# Patient Record
Sex: Female | Born: 1975 | Race: Asian | Hispanic: No | Marital: Married | Smoking: Never smoker
Health system: Southern US, Community
[De-identification: ages and names within clinical notes are randomized; demographics above are authoritative.]

---

## 2017-10-10 ENCOUNTER — Emergency Department (HOSPITAL_COMMUNITY): Payer: 59

## 2017-10-10 ENCOUNTER — Inpatient Hospital Stay (HOSPITAL_COMMUNITY)
Admission: EM | Admit: 2017-10-10 | Discharge: 2017-10-14 | DRG: 872 | Disposition: A | Discharge status: Home / Self Care | Payer: 59 | Attending: Internal Medicine | Admitting: Internal Medicine

## 2017-10-10 ENCOUNTER — Encounter (HOSPITAL_COMMUNITY): Payer: Self-pay

## 2017-10-10 ENCOUNTER — Other Ambulatory Visit: Payer: Self-pay

## 2017-10-10 DIAGNOSIS — A419 Sepsis, unspecified organism: Secondary | ICD-10-CM | POA: Diagnosis present

## 2017-10-10 DIAGNOSIS — Z23 Encounter for immunization: Secondary | ICD-10-CM

## 2017-10-10 DIAGNOSIS — B950 Streptococcus, group A, as the cause of diseases classified elsewhere: Secondary | ICD-10-CM | POA: Diagnosis present

## 2017-10-10 DIAGNOSIS — K760 Fatty (change of) liver, not elsewhere classified: Secondary | ICD-10-CM | POA: Diagnosis present

## 2017-10-10 DIAGNOSIS — D72829 Elevated white blood cell count, unspecified: Secondary | ICD-10-CM | POA: Diagnosis present

## 2017-10-10 DIAGNOSIS — N739 Female pelvic inflammatory disease, unspecified: Secondary | ICD-10-CM | POA: Diagnosis present

## 2017-10-10 DIAGNOSIS — R1030 Lower abdominal pain, unspecified: Secondary | ICD-10-CM | POA: Diagnosis present

## 2017-10-10 DIAGNOSIS — D649 Anemia, unspecified: Secondary | ICD-10-CM | POA: Diagnosis present

## 2017-10-10 DIAGNOSIS — R6510 Systemic inflammatory response syndrome (SIRS) of non-infectious origin without acute organ dysfunction: Secondary | ICD-10-CM

## 2017-10-10 LAB — CBC
HCT: 38.2 % (ref 36.0–46.0)
HEMATOCRIT: 35.7 % — AB (ref 36.0–46.0)
HEMOGLOBIN: 12.6 g/dL (ref 12.0–15.0)
HEMOGLOBIN: 11.8 g/dL — AB (ref 12.0–15.0)
MCH: 29.5 pg (ref 26.0–34.0)
MCH: 29.6 pg (ref 26.0–34.0)
MCHC: 33 g/dL (ref 30.0–36.0)
MCHC: 33.1 g/dL (ref 30.0–36.0)
MCV: 89.5 fL (ref 78.0–100.0)
MCV: 89.5 fL (ref 78.0–100.0)
Platelets: 419 10*3/uL — ABNORMAL HIGH (ref 150–400)
Platelets: 398 10*3/uL (ref 150–400)
RBC: 4.27 MIL/uL (ref 3.87–5.11)
RBC: 3.99 MIL/uL (ref 3.87–5.11)
RDW: 12.7 % (ref 11.5–15.5)
RDW: 12.6 % (ref 11.5–15.5)
WBC: 24.1 10*3/uL — ABNORMAL HIGH (ref 4.0–10.5)
WBC: 26.6 10*3/uL — ABNORMAL HIGH (ref 4.0–10.5)

## 2017-10-10 LAB — WET PREP, GENITAL
Clue Cells Wet Prep HPF POC: NONE SEEN
Sperm: NONE SEEN
Trich, Wet Prep: NONE SEEN
Yeast Wet Prep HPF POC: NONE SEEN

## 2017-10-10 LAB — COMPREHENSIVE METABOLIC PANEL
ALBUMIN: 3.2 g/dL — AB (ref 3.5–5.0)
ALT: 80 U/L — ABNORMAL HIGH (ref 0–44)
AST: 41 U/L (ref 15–41)
Alkaline Phosphatase: 334 U/L — ABNORMAL HIGH (ref 38–126)
Anion gap: 15 (ref 5–15)
BILIRUBIN TOTAL: 1.4 mg/dL — AB (ref 0.3–1.2)
BUN: <5 mg/dL — ABNORMAL LOW (ref 6–20)
CO2: 26 mmol/L (ref 22–32)
Calcium: 9.7 mg/dL (ref 8.9–10.3)
Chloride: 96 mmol/L — ABNORMAL LOW (ref 98–111)
Creatinine, Ser: 0.83 mg/dL (ref 0.44–1.00)
GFR calc Af Amer: >60 mL/min (ref >60)
GFR calc non Af Amer: >60 mL/min (ref >60)
GLUCOSE: 132 mg/dL — AB (ref 70–99)
POTASSIUM: 4.2 mmol/L (ref 3.5–5.1)
SODIUM: 137 mmol/L (ref 135–145)
Total Protein: 8.1 g/dL (ref 6.5–8.1)

## 2017-10-10 LAB — URINALYSIS, ROUTINE W REFLEX MICROSCOPIC
BACTERIA UA: NONE SEEN
Bilirubin Urine: NEGATIVE
Glucose, UA: NEGATIVE mg/dL
Ketones, ur: NEGATIVE mg/dL
Nitrite: NEGATIVE
PROTEIN: NEGATIVE mg/dL
Specific Gravity, Urine: 1.008 (ref 1.005–1.030)
pH: 7 (ref 5.0–8.0)

## 2017-10-10 LAB — LIPASE, BLOOD: Lipase: 94 U/L — ABNORMAL HIGH (ref 11–51)

## 2017-10-10 LAB — CREATININE, SERUM
Creatinine, Ser: 0.71 mg/dL (ref 0.44–1.00)
GFR calc Af Amer: >60 mL/min (ref >60)
GFR calc non Af Amer: >60 mL/min (ref >60)

## 2017-10-10 LAB — I-STAT BETA HCG BLOOD, ED (MC, WL, AP ONLY): HCG, QUANTITATIVE: 16.5 m[IU]/mL — AB (ref <5)

## 2017-10-10 LAB — I-STAT CG4 LACTIC ACID, ED: LACTIC ACID, VENOUS: 1.5 mmol/L (ref 0.5–1.9)

## 2017-10-10 LAB — PREGNANCY, URINE: PREG TEST UR: NEGATIVE

## 2017-10-10 MED ORDER — SODIUM CHLORIDE 0.9 % IV SOLN
1.0000 g | Freq: Once | INTRAVENOUS | Status: AC
  Administered 2017-10-10: 1 g via INTRAVENOUS
  Filled 2017-10-10: qty 10

## 2017-10-10 MED ORDER — PIPERACILLIN-TAZOBACTAM 3.375 G IVPB
3.3750 g | Freq: Three times a day (TID) | INTRAVENOUS | Status: DC
  Administered 2017-10-11 – 2017-10-13 (×8): 3.375 g via INTRAVENOUS
  Filled 2017-10-10 (×7): qty 50

## 2017-10-10 MED ORDER — PIPERACILLIN-TAZOBACTAM 3.375 G IVPB 30 MIN
3.3750 g | Freq: Once | INTRAVENOUS | Status: AC
  Administered 2017-10-10: 3.375 g via INTRAVENOUS
  Filled 2017-10-10: qty 50

## 2017-10-10 MED ORDER — ADULT MULTIVITAMIN W/MINERALS CH
1.0000 | ORAL_TABLET | Freq: Every day | ORAL | Status: DC
  Administered 2017-10-11 – 2017-10-14 (×4): 1 via ORAL
  Filled 2017-10-10 (×4): qty 1

## 2017-10-10 MED ORDER — KETOROLAC TROMETHAMINE 15 MG/ML IJ SOLN
15.0000 mg | Freq: Four times a day (QID) | INTRAMUSCULAR | Status: DC | PRN
  Administered 2017-10-11 – 2017-10-12 (×3): 15 mg via INTRAVENOUS
  Filled 2017-10-10 (×3): qty 1

## 2017-10-10 MED ORDER — ENOXAPARIN SODIUM 40 MG/0.4ML ~~LOC~~ SOLN
40.0000 mg | SUBCUTANEOUS | Status: DC
  Filled 2017-10-10: qty 0.4

## 2017-10-10 MED ORDER — SODIUM CHLORIDE 0.9 % IV SOLN
100.0000 mg | Freq: Two times a day (BID) | INTRAVENOUS | Status: DC
  Administered 2017-10-11 – 2017-10-12 (×5): 100 mg via INTRAVENOUS
  Filled 2017-10-10 (×6): qty 100

## 2017-10-10 MED ORDER — SODIUM CHLORIDE 0.9 % IV BOLUS
1000.0000 mL | Freq: Once | INTRAVENOUS | Status: AC
  Administered 2017-10-10: 1000 mL via INTRAVENOUS

## 2017-10-10 MED ORDER — SODIUM CHLORIDE 0.9 % IV SOLN
100.0000 mg | Freq: Once | INTRAVENOUS | Status: AC
  Administered 2017-10-10: 100 mg via INTRAVENOUS
  Filled 2017-10-10: qty 100

## 2017-10-10 MED ORDER — SODIUM CHLORIDE 0.9 % IV SOLN
INTRAVENOUS | Status: DC
  Administered 2017-10-10 – 2017-10-13 (×5): via INTRAVENOUS

## 2017-10-10 MED ORDER — VITAMIN E 180 MG (400 UNIT) PO CAPS
400.0000 [IU] | ORAL_CAPSULE | Freq: Every day | ORAL | Status: DC
  Administered 2017-10-11 – 2017-10-14 (×4): 400 [IU] via ORAL
  Filled 2017-10-10 (×4): qty 1

## 2017-10-10 MED ORDER — IBUPROFEN 400 MG PO TABS
600.0000 mg | ORAL_TABLET | Freq: Once | ORAL | Status: AC
  Administered 2017-10-10: 600 mg via ORAL
  Filled 2017-10-10: qty 1

## 2017-10-10 MED ORDER — IOPAMIDOL (ISOVUE-300) INJECTION 61%
100.0000 mL | Freq: Once | INTRAVENOUS | Status: AC
  Administered 2017-10-10: 100 mL via INTRAVENOUS

## 2017-10-10 NOTE — ED Notes (Signed)
Pt feeling better

## 2017-10-10 NOTE — Consult Note (Signed)
Reason for Consult: Pelvic abscess Referring Physician: Darl Householder MD  Angel Anderson is an 42 y.o. female.  HPI: Asked to see patient at the request of Dr. Darl Householder for pelvic abscess.  The patient relates a one-week history of low-grade pelvic pain.  She had been exercising recently and thought it was muscle soreness.  The pain is been a low-grade just above her pubis and has been progressive over the last 7 days.  The patient denies any nausea or vomiting.  The patient denies any blood in her stool constipation or diarrhea and her bowel function has been normal.  The patient denies any dysuria or pelvic discharge.  CT scan was done which showed a complex left adnexal mass which appears to be an abscess.  Patient feels better since she has been in the emergency room since this morning with IV fluids and antibiotics.  Her pain is quite minimal now.  She is hungry.  History reviewed. No pertinent past medical history.  History reviewed. No pertinent surgical history.  History reviewed. No pertinent family history.  Social History:  reports that she has never smoked. She has never used smokeless tobacco. She reports that she does not drink alcohol. Her drug history is not on file.  Allergies: No Known Allergies  Medications: I have reviewed the patient's current medications.  Results for orders placed or performed during the hospital encounter of 10/10/17 (from the past 48 hour(s))  Urinalysis, Routine w reflex microscopic     Status: Abnormal   Collection Time: 10/10/17  9:50 AM  Result Value Ref Range   Color, Urine YELLOW YELLOW   APPearance CLEAR CLEAR   Specific Gravity, Urine 1.008 1.005 - 1.030   pH 7.0 5.0 - 8.0   Glucose, UA NEGATIVE NEGATIVE mg/dL   Hgb urine dipstick SMALL (A) NEGATIVE   Bilirubin Urine NEGATIVE NEGATIVE   Ketones, ur NEGATIVE NEGATIVE mg/dL   Protein, ur NEGATIVE NEGATIVE mg/dL   Nitrite NEGATIVE NEGATIVE   Leukocytes, UA TRACE (A) NEGATIVE   RBC / HPF 0-5 0 - 5  RBC/hpf   WBC, UA 11-20 0 - 5 WBC/hpf   Bacteria, UA NONE SEEN NONE SEEN   Squamous Epithelial / LPF 0-5 0 - 5    Comment: Performed at [facility], [place] [address], [place], [place] [address]  Lipase, blood     Status: Abnormal   Collection Time: 10/10/17  9:54 AM  Result Value Ref Range   Lipase 94 (H) 11 - 51 U/L    Comment: Performed at [facility] [address], [place], [place] [address]  Comprehensive metabolic panel     Status: Abnormal   Collection Time: 10/10/17  9:54 AM  Result Value Ref Range   Sodium 137 135 - 145 mmol/L   Potassium 4.2 3.5 - 5.1 mmol/L   Chloride 96 (L) 98 - 111 mmol/L   CO2 26 22 - 32 mmol/L   Glucose, Bld 132 (H) 70 - 99 mg/dL   BUN <5 (L) 6 - 20 mg/dL   Creatinine, Ser 0.83 0.44 - 1.00 mg/dL   Calcium 9.7 8.9 - 10.3 mg/dL   Total Protein 8.1 6.5 - 8.1 g/dL   Albumin 3.2 (L) 3.5 - 5.0 g/dL   AST 41 15 - 41 U/L   ALT 80 (H) 0 - 44 U/L   Alkaline Phosphatase 334 (H) 38 - 126 U/L   Total Bilirubin 1.4 (H) 0.3 - 1.2 mg/dL   GFR calc non  Af Amer >60 >60 mL/min   GFR calc Af Amer >60 >60 mL/min    Comment: (NOTE) The eGFR has been calculated using the CKD EPI equation. This calculation has not been validated in all clinical situations. eGFR's persistently <60 mL/min signify possible Chronic Kidney Disease.    Anion gap 15 5 - 15    Comment: Performed at [facility] [address] [place], [place] [address]  CBC     Status: Abnormal   Collection Time: 10/10/17  9:54 AM  Result Value Ref Range   WBC 24.1 (H) 4.0 - 10.5 K/uL   RBC 4.27 3.87 - 5.11 MIL/uL   Hemoglobin 12.6 12.0 - 15.0 g/dL   HCT 38.2 36.0 - 46.0 %   MCV 89.5 78.0 - 100.0 fL   MCH 29.5 26.0 - 34.0 pg   MCHC 33.0 30.0 - 36.0 g/dL   RDW 12.7 11.5 - 15.5 %   Platelets 419 (H) 150 - 400 K/uL    Comment: Performed at [facility], [place] [address] [place], [place] [address]  I-Stat beta hCG blood, ED     Status: Abnormal   Collection Time: 10/10/17 10:18  AM  Result Value Ref Range   I-stat hCG, quantitative 16.5 (H) <5 mIU/mL   Comment 3            Comment:   GEST. AGE      CONC.  (mIU/mL)   <=1 WEEK        5 - 50     2 WEEKS       50 - 500     3 WEEKS       100 - 10,000     4 WEEKS     1,000 - 30,000        FEMALE AND NON-PREGNANT FEMALE:     LESS THAN 5 mIU/mL   Pregnancy, urine     Status: None   Collection Time: 10/10/17 11:56 AM  Result Value Ref Range   Preg Test, Ur NEGATIVE NEGATIVE    Comment:        THE SENSITIVITY OF THIS METHODOLOGY IS >20 mIU/mL. Performed at [facility], [place] [address] [place], [place] [address]   Wet prep, genital     Status: Abnormal   Collection Time: 10/10/17  5:57 PM  Result Value Ref Range   Yeast Wet Prep HPF POC NONE SEEN NONE SEEN   Trich, Wet Prep NONE SEEN NONE SEEN   Clue Cells Wet Prep HPF POC NONE SEEN NONE SEEN   WBC, Wet Prep HPF POC MANY (A) NONE SEEN   Sperm NONE SEEN     Comment: Performed at [facility], [place] [address], [place], [place] [address]  I-Stat CG4 Lactic Acid, ED     Status: None   Collection Time: 10/10/17  6:18 PM  Result Value Ref Range   Lactic Acid, Venous 1.50 0.5 - 1.9 mmol/L    US Transvaginal Non-ob  Result Date: 10/10/2017 CLINICAL DATA:  Pelvic pain EXAM: TRANSABDOMINAL AND TRANSVAGINAL ULTRASOUND OF PELVIS DOPPLER ULTRASOUND OF OVARIES TECHNIQUE: Both transabdominal and transvaginal ultrasound examinations of the pelvis were performed. Transabdominal technique was performed for global imaging of the pelvis including uterus, ovaries, adnexal regions, and pelvic cul-de-sac. It was necessary to proceed with endovaginal exam following the transabdominal exam to visualize the uterus endometrium and ovaries. Color and duplex Doppler ultrasound was utilized to evaluate blood flow to the ovaries. COMPARISON:  CT 10/10/2017 FINDINGS: Uterus Measurements: 7.6 x 4.6 x 5 cm. No fibroids or other mass visualized. Endometrium Thickness: 6.6 mm.  No focal  abnormality visualized. Right ovary Measurements: 3.6 x 1.6 x 2.8 cm. Small septated cyst versus adjacent cysts measuring 1.8 cm. Left ovary Measurements: 3.9 x 2.9 x 3.6 cm within the left adnexa, adjacent and possibly involving left ovary and extending over the superior aspect of left uterus is a large complex mass measuring 9 x 5.5 x 8.8 cm, corresponding to complex fluid collection on CT. 1.4 cm echogenic calcification at the left adnexa corresponding to CT calcification. Pulsed Doppler evaluation of both ovaries demonstrates normal low-resistance arterial and venous waveforms. Other findings Trace free fluid IMPRESSION: 1. Negative for ovarian torsion. 2. 9 cm complex mass within the left adnexa and adjacent to the left ovary, extending superior to the left aspect of uterus. Findings could be secondary to tubo-ovarian abscess in the correct clinical setting. Note that the complex mass cannot be distinguished from adjacent sigmoid colon on the CT and bowel perforation with abscess/secondary involvement of the left adnexa is also included in the differential. Electronically Signed   By: Donavan Foil M.D.   On: 10/10/2017 16:58   US Pelvis Complete  Result Date: 10/10/2017 CLINICAL DATA:  Pelvic pain EXAM: TRANSABDOMINAL AND TRANSVAGINAL ULTRASOUND OF PELVIS DOPPLER ULTRASOUND OF OVARIES TECHNIQUE: Both transabdominal and transvaginal ultrasound examinations of the pelvis were performed. Transabdominal technique was performed for global imaging of the pelvis including uterus, ovaries, adnexal regions, and pelvic cul-de-sac. It was necessary to proceed with endovaginal exam following the transabdominal exam to visualize the uterus endometrium and ovaries. Color and duplex Doppler ultrasound was utilized to evaluate blood flow to the ovaries. COMPARISON:  CT 10/10/2017 FINDINGS: Uterus Measurements: 7.6 x 4.6 x 5 cm. No fibroids or other mass visualized. Endometrium Thickness: 6.6 mm.  No focal abnormality  visualized. Right ovary Measurements: 3.6 x 1.6 x 2.8 cm. Small septated cyst versus adjacent cysts measuring 1.8 cm. Left ovary Measurements: 3.9 x 2.9 x 3.6 cm within the left adnexa, adjacent and possibly involving left ovary and extending over the superior aspect of left uterus is a large complex mass measuring 9 x 5.5 x 8.8 cm, corresponding to complex fluid collection on CT. 1.4 cm echogenic calcification at the left adnexa corresponding to CT calcification. Pulsed Doppler evaluation of both ovaries demonstrates normal low-resistance arterial and venous waveforms. Other findings Trace free fluid IMPRESSION: 1. Negative for ovarian torsion. 2. 9 cm complex mass within the left adnexa and adjacent to the left ovary, extending superior to the left aspect of uterus. Findings could be secondary to tubo-ovarian abscess in the correct clinical setting. Note that the complex mass cannot be distinguished from adjacent sigmoid colon on the CT and bowel perforation with abscess/secondary involvement of the left adnexa is also included in the differential. Electronically Signed   By: Donavan Foil M.D.   On: 10/10/2017 16:58   Ct Abdomen Pelvis W Contrast  Result Date: 10/10/2017 CLINICAL DATA:  Leukocytosis. Right lower quadrant and upper quadrant pain. EXAM: CT ABDOMEN AND PELVIS WITH CONTRAST TECHNIQUE: Multidetector CT imaging of the abdomen and pelvis was performed using the standard protocol following bolus administration of intravenous contrast. CONTRAST:  163m ISOVUE-300 IOPAMIDOL (ISOVUE-300) INJECTION 61% COMPARISON:  None. FINDINGS: Lower chest: Left basilar linear atelectasis or scarring is present. The heart size is normal. No significant pleural or pericardial effusion is present. Hepatobiliary: Fatty infiltration of the liver is suggested. No  discrete lesions are present. There is no biliary dilation. Common bile duct is normal. Pancreas: Unremarkable. No pancreatic ductal dilatation or surrounding  inflammatory changes. Spleen: Normal in size without focal abnormality. Adrenals/Urinary Tract: Adrenal glands are normal. A 12 mm simple cyst is present in the midportion of the left kidney. A second cyst measures 6 mm. Two subcentimeter cysts are present at the lower pole of the right kidney. Ureters are within normal limits bilaterally. The urinary bladder is normal. Stomach/Bowel: The stomach and duodenum are within normal limits. There is mild dilation of distal small bowel, likely ileus adjacent to the peripherally enhancing abscess. No obstruction is present. Terminal ileum is somewhat thickened. The appendix is visualized and within normal limits. The ascending and transverse colon are within normal limits. The proximal descending colon is mostly collapsed. The sigmoid colon passes immediately adjacent to the peripherally enhancing collection. Vascular/Lymphatic: Subcentimeter left iliac nodes are likely reactive. No significant adenopathy is present. Reproductive: The uterus is somewhat edematous. A multiloculated peripherally enhancing fluid collection lies immediately superior to the uterus and about the left adnexa. The largest component measures 5.8 x 4.2 x 3.2 cm. A 12 mm calcification is also present in the left adnexa. The right adnexa is unremarkable. Other: No layering free fluid is present. There is edema in the distal small bowel mesentery adjacent to the collection. No free air is present. Musculoskeletal: Vertebral body heights are normal. No focal lytic or blastic lesions are present. The bony pelvis is intact. The hips are located and within normal limits. IMPRESSION: 1. Complex peripherally enhancing fluid collection centered about the left adnexa and uterus may be loculated. The largest component measures 5.8 x 4.2 x 2.7 cm. This most likely represents a complicated tubo-ovarian abscess. Complicated sigmoid colitis is considered less likely without other inflammatory changes about the  colon. The colon appears to be collapsed and passes adjacent to the collection. 2. 12 mm calcification in the left adnexa likely represents a dystrophic left ovarian calcification. 3. Mild dilation of distal small bowel adjacent to the collection likely represents ileus and secondary inflammation. 4. Edema is present in the distal small bowel mesentery adjacent to the collection. 5. Probable hepatic steatosis. 6. Bilateral renal cysts appear benign. These results were called by telephone at the time of interpretation on 10/10/2017 at 2:42 pm to [facility], who verbally acknowledged these results. Electronically Signed   By: San Morelle M.D.   On: 10/10/2017 14:48   [place] Art/ven Flow Abd Pelv Doppler  Result Date: 10/10/2017 CLINICAL DATA:  Pelvic pain EXAM: TRANSABDOMINAL AND TRANSVAGINAL ULTRASOUND OF PELVIS DOPPLER ULTRASOUND OF OVARIES TECHNIQUE: Both transabdominal and transvaginal ultrasound examinations of the pelvis were performed. Transabdominal technique was performed for global imaging of the pelvis including uterus, ovaries, adnexal regions, and pelvic cul-de-sac. It was necessary to proceed with endovaginal exam following the transabdominal exam to visualize the uterus endometrium and ovaries. Color and duplex Doppler ultrasound was utilized to evaluate blood flow to the ovaries. COMPARISON:  CT 10/10/2017 FINDINGS: Uterus Measurements: 7.6 x 4.6 x 5 cm. No fibroids or other mass visualized. Endometrium Thickness: 6.6 mm.  No focal abnormality visualized. Right ovary Measurements: 3.6 x 1.6 x 2.8 cm. Small septated cyst versus adjacent cysts measuring 1.8 cm. Left ovary Measurements: 3.9 x 2.9 x 3.6 cm within the left adnexa, adjacent and possibly involving left ovary and extending over the superior aspect of left uterus is a large complex mass measuring 9 x 5.5 x 8.8 cm, corresponding to  complex fluid collection on CT. 1.4 cm echogenic calcification at the left adnexa corresponding to CT  calcification. Pulsed Doppler evaluation of both ovaries demonstrates normal low-resistance arterial and venous waveforms. Other findings Trace free fluid IMPRESSION: 1. Negative for ovarian torsion. 2. 9 cm complex mass within the left adnexa and adjacent to the left ovary, extending superior to the left aspect of uterus. Findings could be secondary to tubo-ovarian abscess in the correct clinical setting. Note that the complex mass cannot be distinguished from adjacent sigmoid colon on the CT and bowel perforation with abscess/secondary involvement of the left adnexa is also included in the differential. Electronically Signed   By: Donavan Foil M.D.   On: 10/10/2017 16:58    Review of Systems  Constitutional: Positive for chills and fever.  HENT: Negative for hearing loss.   Eyes: Negative for blurred vision and double vision.  Respiratory: Negative for cough and hemoptysis.   Cardiovascular: Negative for chest pain and palpitations.  Gastrointestinal: Positive for abdominal pain. Negative for constipation, diarrhea, nausea and vomiting.  Genitourinary: Negative for dysuria, flank pain, frequency, hematuria and urgency.  Musculoskeletal: Negative for back pain and myalgias.  Skin: Negative for rash.  Neurological: Negative for tremors and sensory change.  Endo/Heme/Allergies: Negative for environmental allergies. Does not bruise/bleed easily.  Psychiatric/Behavioral: The patient is not nervous/anxious and does not have insomnia.    Blood pressure 127/80, pulse (!) 101, temperature (!) 100.6 F (38.1 C), resp. rate 18, height '5\' 3"'  (1.6 m), weight 64 kg, last menstrual period 09/17/2017, SpO2 100 %. Physical Exam  Constitutional: She is oriented to person, place, and time. She appears well-developed and well-nourished.  HENT:  Head: Normocephalic and atraumatic.  Eyes: Pupils are equal, round, and reactive to light. No scleral icterus.  Neck: Normal range of motion. Neck supple.   Cardiovascular: Normal rate and regular rhythm.  Respiratory: Effort normal and breath sounds normal.  GI: Soft. She exhibits no distension and no mass. There is tenderness. There is no rebound and no guarding.  Musculoskeletal: Normal range of motion.  Neurological: She is alert and oriented to person, place, and time.  Skin: Skin is warm and dry.  Psychiatric: She has a normal mood and affect. Her behavior is normal.    Assessment/Plan: Pelvic abscess-the colon looks normal and CT scan there is no evidence of diverticuli nor history of diverticulitis in the past.  This appears to be a left adnexal mass/cyst.  He does have a white count of 24,000 and would favor GYN etiology.  No history of previous pelvic inflammatory disease or vaginal discharge though.  In any event, the treatment would be similar.  Recommend percutaneous drainage by interventional radiology and IV antibiotics.  I would cover for both GYN pathogens as well as colonic pathogens.  She is feeling much better with IV fluids and antibiotics.  Okay to advance diet but would make n.p.o. after midnight for interventional radiology if they feel this is drainable.  If not, continue IV antibiotics and we will follow along.  No acute surgical need at this point time.  Recommend further work-up if this condition resolves with colonoscopy and potential GYN evaluation.  Marqual Mi A Dineen Conradt 10/10/2017, 7:03 PM

## 2017-10-10 NOTE — ED Notes (Signed)
Pt taken to ultra sound  Antibiotic running

## 2017-10-10 NOTE — ED Notes (Signed)
thye surgeon has seen this pt

## 2017-10-10 NOTE — Progress Notes (Signed)
Report received from [place], [place]. Patient arrived to floor from ED. Alert and oriented x4. No family at bedside.

## 2017-10-10 NOTE — ED Notes (Signed)
Pt returned fron [place]

## 2017-10-10 NOTE — ED Notes (Signed)
Patient transported to CT 

## 2017-10-10 NOTE — ED Notes (Signed)
ic fluid and antibiotics started

## 2017-10-10 NOTE — ED Triage Notes (Signed)
Pt states she went to [facility] last week for abd pain and was called and told she had elevated WBC. She was given abx for UTI but pain has persisted.

## 2017-10-10 NOTE — ED Notes (Signed)
Pt  Returned from [place]  Pain med given  2nd antibiotic hung

## 2017-10-10 NOTE — ED Provider Notes (Signed)
[place] EMERGENCY DEPARTMENT Provider Note   CSN: [id number] Arrival date & time: 10/10/17  0931     History   Chief Complaint Chief Complaint  Patient presents with  . Abnormal Lab    HPI Angel Anderson is a 42 y.o. female with h/o kidney stone here for evaluation of abdominal pain. Onset 1 week ago. Described as mild, crampy, intermittent localized diffusely in lower abdomen.  Initially intermittent and worse after eating and during urination, now it is random, intermittent and not as severe. She went to urgent care 2 days ago and was diagnosed with UTI and has been taking macrobid x 2 days.  Since starting abx pain is not as severe and it is persistent.  Has been taking tylenol to help with pain with good relief.  Pain is worse with empty stomach and right before urination and having a BM.  Associated with chills and generalized weakness, fatigue.  Has noticed urinary frequency that she attributes to increased water intake.  LMP early august, s/p BTL and c-section.  No h/o ulcers, frequent ETOH or NSAID use. Was called by UC today and told to come to ER or PCP for WBC 24 and elevated liver enzymes.   No associated nausea, vomiting, diarrhea, constipation, dysuria, hematuria, changes in vaginal discharge or bleeding.    HPI  History reviewed. No pertinent past medical history.  There are no active problems to display for this patient.   History reviewed. No pertinent surgical history.   OB History   None      Home Medications    Prior to Admission medications   Medication Sig Start Date End Date Taking? Authorizing Provider  Multiple Vitamins-Minerals (MULTIVITAMIN WITH MINERALS) tablet Take 1 tablet by mouth daily.   Yes [provider]  nitrofurantoin, macrocrystal-monohydrate, (MACROBID) 100 MG capsule Take 100 mg by mouth 2 (two) times daily. x10 days   Yes [provider]  vitamin E 400 UNIT capsule Take 400 Units by mouth  daily.   Yes [provider]    Family History History reviewed. No pertinent family history.  Social History Social History   Tobacco Use  . Smoking status: Never Smoker  . Smokeless tobacco: Never Used  Substance Use Topics  . Alcohol use: Never    Frequency: Never  . Drug use: Not on file     Allergies   Patient has no known allergies.   Review of Systems Review of Systems  Constitutional: Positive for chills.  Gastrointestinal: Positive for abdominal pain.  Neurological: Positive for weakness.  All other systems reviewed and are negative.    Physical Exam Updated Vital Signs BP 127/80   Pulse (!) 101   Temp (!) 100.6 F (38.1 C)   Resp 18   Ht '5\' 3"'  (1.6 m)   Wt 64 kg   LMP 09/17/2017 (Within Days)   SpO2 100%   BMI 24.98 kg/m   Physical Exam  Constitutional: She is oriented to person, place, and time. She appears well-developed and well-nourished.  Non toxic  HENT:  Head: Normocephalic and atraumatic.  Nose: Nose normal.  MMM  Eyes: Pupils are equal, round, and reactive to light. Conjunctivae and EOM are normal.  Neck: Normal range of motion.  Cardiovascular: Normal rate and regular rhythm.  Pulmonary/Chest: Effort normal and breath sounds normal.  Abdominal: Soft. Bowel sounds are normal. There is tenderness.  Diffuse lower abdomen tenderness, worst at suprapubic and RLQ.  Positive McBurney's. Mild/minimal RUQ tenderness.  No G/R/R. No suprapubic or CVA tenderness. Negative Murphy's. Active BS to lower quadrants.   Genitourinary:  Genitourinary Comments:  External genitalia normal without erythema, edema, tenderness, discharge or lesions.  No groin lymphadenopathy.  Vaginal mucosa and cervix normal, pink without discharge or lesions.  Uterus in midline, smooth, not enlarged or tender.  No CMT. Non palpable adnexa.  Musculoskeletal: Normal range of motion.  Neurological: She is alert and oriented to person, place, and time.  Skin: Skin  is warm and dry. Capillary refill takes less than 2 seconds.  Psychiatric: She has a normal mood and affect. Her behavior is normal.  Nursing note and vitals reviewed.    ED Treatments / Results  Labs (all labs ordered are listed, but only abnormal results are displayed) Labs Reviewed  WET PREP, GENITAL - Abnormal; Notable for the following components:      Result Value   WBC, Wet Prep HPF POC MANY (*)    All other components within normal limits  LIPASE, BLOOD - Abnormal; Notable for the following components:   Lipase 94 (*)    All other components within normal limits  COMPREHENSIVE METABOLIC PANEL - Abnormal; Notable for the following components:   Chloride 96 (*)    Glucose, Bld 132 (*)    BUN <5 (*)    Albumin 3.2 (*)    ALT 80 (*)    Alkaline Phosphatase 334 (*)    Total Bilirubin 1.4 (*)    All other components within normal limits  CBC - Abnormal; Notable for the following components:   WBC 24.1 (*)    Platelets 419 (*)    All other components within normal limits  URINALYSIS, ROUTINE W REFLEX MICROSCOPIC - Abnormal; Notable for the following components:   Hgb urine dipstick SMALL (*)    Leukocytes, UA TRACE (*)    All other components within normal limits  I-STAT BETA HCG BLOOD, ED ([facility], [facility], [facility] ONLY) - Abnormal; Notable for the following components:   I-stat hCG, quantitative 16.5 (*)    All other components within normal limits  CULTURE, BLOOD (ROUTINE X 2)  CULTURE, BLOOD (ROUTINE X 2)  PREGNANCY, URINE  I-STAT CG4 LACTIC ACID, ED  I-STAT CG4 LACTIC ACID, ED  GC/CHLAMYDIA PROBE AMP ([place]) NOT AT [facility]    EKG None  Radiology US Transvaginal Non-ob  Result Date: 10/10/2017 CLINICAL DATA:  Pelvic pain EXAM: TRANSABDOMINAL AND TRANSVAGINAL ULTRASOUND OF PELVIS DOPPLER ULTRASOUND OF OVARIES TECHNIQUE: Both transabdominal and transvaginal ultrasound examinations of the pelvis were performed. Transabdominal technique was performed for global imaging of the  pelvis including uterus, ovaries, adnexal regions, and pelvic cul-de-sac. It was necessary to proceed with endovaginal exam following the transabdominal exam to visualize the uterus endometrium and ovaries. Color and duplex Doppler ultrasound was utilized to evaluate blood flow to the ovaries. COMPARISON:  CT 10/10/2017 FINDINGS: Uterus Measurements: 7.6 x 4.6 x 5 cm. No fibroids or other mass visualized. Endometrium Thickness: 6.6 mm.  No focal abnormality visualized. Right ovary Measurements: 3.6 x 1.6 x 2.8 cm. Small septated cyst versus adjacent cysts measuring 1.8 cm. Left ovary Measurements: 3.9 x 2.9 x 3.6 cm within the left adnexa, adjacent and possibly involving left ovary and extending over the superior aspect of left uterus is a large complex mass measuring 9 x 5.5 x 8.8 cm, corresponding to complex fluid collection on CT. 1.4 cm echogenic calcification at the left adnexa corresponding to CT calcification. Pulsed Doppler evaluation of both ovaries demonstrates normal  low-resistance arterial and venous waveforms. Other findings Trace free fluid IMPRESSION: 1. Negative for ovarian torsion. 2. 9 cm complex mass within the left adnexa and adjacent to the left ovary, extending superior to the left aspect of uterus. Findings could be secondary to tubo-ovarian abscess in the correct clinical setting. Note that the complex mass cannot be distinguished from adjacent sigmoid colon on the CT and bowel perforation with abscess/secondary involvement of the left adnexa is also included in the differential. Electronically Signed   By: Donavan Foil M.D.   On: 10/10/2017 16:58   US Pelvis Complete  Result Date: 10/10/2017 CLINICAL DATA:  Pelvic pain EXAM: TRANSABDOMINAL AND TRANSVAGINAL ULTRASOUND OF PELVIS DOPPLER ULTRASOUND OF OVARIES TECHNIQUE: Both transabdominal and transvaginal ultrasound examinations of the pelvis were performed. Transabdominal technique was performed for global imaging of the pelvis including  uterus, ovaries, adnexal regions, and pelvic cul-de-sac. It was necessary to proceed with endovaginal exam following the transabdominal exam to visualize the uterus endometrium and ovaries. Color and duplex Doppler ultrasound was utilized to evaluate blood flow to the ovaries. COMPARISON:  CT 10/10/2017 FINDINGS: Uterus Measurements: 7.6 x 4.6 x 5 cm. No fibroids or other mass visualized. Endometrium Thickness: 6.6 mm.  No focal abnormality visualized. Right ovary Measurements: 3.6 x 1.6 x 2.8 cm. Small septated cyst versus adjacent cysts measuring 1.8 cm. Left ovary Measurements: 3.9 x 2.9 x 3.6 cm within the left adnexa, adjacent and possibly involving left ovary and extending over the superior aspect of left uterus is a large complex mass measuring 9 x 5.5 x 8.8 cm, corresponding to complex fluid collection on CT. 1.4 cm echogenic calcification at the left adnexa corresponding to CT calcification. Pulsed Doppler evaluation of both ovaries demonstrates normal low-resistance arterial and venous waveforms. Other findings Trace free fluid IMPRESSION: 1. Negative for ovarian torsion. 2. 9 cm complex mass within the left adnexa and adjacent to the left ovary, extending superior to the left aspect of uterus. Findings could be secondary to tubo-ovarian abscess in the correct clinical setting. Note that the complex mass cannot be distinguished from adjacent sigmoid colon on the CT and bowel perforation with abscess/secondary involvement of the left adnexa is also included in the differential. Electronically Signed   By: Donavan Foil M.D.   On: 10/10/2017 16:58   Ct Abdomen Pelvis W Contrast  Result Date: 10/10/2017 CLINICAL DATA:  Leukocytosis. Right lower quadrant and upper quadrant pain. EXAM: CT ABDOMEN AND PELVIS WITH CONTRAST TECHNIQUE: Multidetector CT imaging of the abdomen and pelvis was performed using the standard protocol following bolus administration of intravenous contrast. CONTRAST:  147m ISOVUE-300  IOPAMIDOL (ISOVUE-300) INJECTION 61% COMPARISON:  None. FINDINGS: Lower chest: Left basilar linear atelectasis or scarring is present. The heart size is normal. No significant pleural or pericardial effusion is present. Hepatobiliary: Fatty infiltration of the liver is suggested. No discrete lesions are present. There is no biliary dilation. Common bile duct is normal. Pancreas: Unremarkable. No pancreatic ductal dilatation or surrounding inflammatory changes. Spleen: Normal in size without focal abnormality. Adrenals/Urinary Tract: Adrenal glands are normal. A 12 mm simple cyst is present in the midportion of the left kidney. A second cyst measures 6 mm. Two subcentimeter cysts are present at the lower pole of the right kidney. Ureters are within normal limits bilaterally. The urinary bladder is normal. Stomach/Bowel: The stomach and duodenum are within normal limits. There is mild dilation of distal small bowel, likely ileus adjacent to the peripherally enhancing abscess. No obstruction is present. Terminal  ileum is somewhat thickened. The appendix is visualized and within normal limits. The ascending and transverse colon are within normal limits. The proximal descending colon is mostly collapsed. The sigmoid colon passes immediately adjacent to the peripherally enhancing collection. Vascular/Lymphatic: Subcentimeter left iliac nodes are likely reactive. No significant adenopathy is present. Reproductive: The uterus is somewhat edematous. A multiloculated peripherally enhancing fluid collection lies immediately superior to the uterus and about the left adnexa. The largest component measures 5.8 x 4.2 x 3.2 cm. A 12 mm calcification is also present in the left adnexa. The right adnexa is unremarkable. Other: No layering free fluid is present. There is edema in the distal small bowel mesentery adjacent to the collection. No free air is present. Musculoskeletal: Vertebral body heights are normal. No focal lytic or  blastic lesions are present. The bony pelvis is intact. The hips are located and within normal limits. IMPRESSION: 1. Complex peripherally enhancing fluid collection centered about the left adnexa and uterus may be loculated. The largest component measures 5.8 x 4.2 x 2.7 cm. This most likely represents a complicated tubo-ovarian abscess. Complicated sigmoid colitis is considered less likely without other inflammatory changes about the colon. The colon appears to be collapsed and passes adjacent to the collection. 2. 12 mm calcification in the left adnexa likely represents a dystrophic left ovarian calcification. 3. Mild dilation of distal small bowel adjacent to the collection likely represents ileus and secondary inflammation. 4. Edema is present in the distal small bowel mesentery adjacent to the collection. 5. Probable hepatic steatosis. 6. Bilateral renal cysts appear benign. These results were called by telephone at the time of interpretation on 10/10/2017 at 2:42 pm to [facility], who verbally acknowledged these results. Electronically Signed   By: San Morelle M.D.   On: 10/10/2017 14:48   [place] Art/ven Flow Abd Pelv Doppler  Result Date: 10/10/2017 CLINICAL DATA:  Pelvic pain EXAM: TRANSABDOMINAL AND TRANSVAGINAL ULTRASOUND OF PELVIS DOPPLER ULTRASOUND OF OVARIES TECHNIQUE: Both transabdominal and transvaginal ultrasound examinations of the pelvis were performed. Transabdominal technique was performed for global imaging of the pelvis including uterus, ovaries, adnexal regions, and pelvic cul-de-sac. It was necessary to proceed with endovaginal exam following the transabdominal exam to visualize the uterus endometrium and ovaries. Color and duplex Doppler ultrasound was utilized to evaluate blood flow to the ovaries. COMPARISON:  CT 10/10/2017 FINDINGS: Uterus Measurements: 7.6 x 4.6 x 5 cm. No fibroids or other mass visualized. Endometrium Thickness: 6.6 mm.  No focal abnormality visualized.  Right ovary Measurements: 3.6 x 1.6 x 2.8 cm. Small septated cyst versus adjacent cysts measuring 1.8 cm. Left ovary Measurements: 3.9 x 2.9 x 3.6 cm within the left adnexa, adjacent and possibly involving left ovary and extending over the superior aspect of left uterus is a large complex mass measuring 9 x 5.5 x 8.8 cm, corresponding to complex fluid collection on CT. 1.4 cm echogenic calcification at the left adnexa corresponding to CT calcification. Pulsed Doppler evaluation of both ovaries demonstrates normal low-resistance arterial and venous waveforms. Other findings Trace free fluid IMPRESSION: 1. Negative for ovarian torsion. 2. 9 cm complex mass within the left adnexa and adjacent to the left ovary, extending superior to the left aspect of uterus. Findings could be secondary to tubo-ovarian abscess in the correct clinical setting. Note that the complex mass cannot be distinguished from adjacent sigmoid colon on the CT and bowel perforation with abscess/secondary involvement of the left adnexa is also included in the differential. Electronically Signed  By: Donavan Foil M.D.   On: 10/10/2017 16:58    Procedures .Critical Care Performed by: Kinnie Feil, PA-C Authorized by: Kinnie Feil, PA-C   Critical care provider statement:    Critical care time (minutes):  45   Critical care was necessary to treat or prevent imminent or life-threatening deterioration of the following conditions: pelvic abscess.   Critical care was time spent personally by me on the following activities:  Discussions with consultants, evaluation of patient's response to treatment, examination of patient, ordering and performing treatments and interventions, ordering and review of laboratory studies, ordering and review of radiographic studies, pulse oximetry, re-evaluation of patient's condition, obtaining history from patient or surrogate and review of old charts   I assumed direction of critical care for this  patient from another provider in my specialty: no     (including critical care time)  Medications Ordered in ED Medications  doxycycline (VIBRAMYCIN) 100 mg in sodium chloride 0.9 % 250 mL IVPB (100 mg Intravenous New Bag/Given 10/10/17 1656)  iopamidol (ISOVUE-300) 61 % injection 100 mL (100 mLs Intravenous Contrast Given 10/10/17 1406)  sodium chloride 0.9 % bolus 1,000 mL (0 mLs Intravenous Stopped 10/10/17 1806)  cefTRIAXone (ROCEPHIN) 1 g in sodium chloride 0.9 % 100 mL IVPB (0 g Intravenous Stopped 10/10/17 1652)  ibuprofen (ADVIL,MOTRIN) tablet 600 mg (600 mg Oral Given 10/10/17 1700)     Initial Impression / Assessment and Plan / ED Course  I have reviewed the triage vital signs and the nursing notes.  Pertinent labs & imaging results that were available during my care of the patient were reviewed by me and considered in my medical decision making (see chart for details).  Clinical Course as of Oct 10 1829  Fri Oct 10, 2017  1211 Alkaline Phosphatase(!): 334 [CG]  1212 ALT(!): 80 [CG]  1212 Total Bilirubin(!): 1.4 [CG]  1212 WBC(!): 24.1 [CG]  1212 Hgb urine dipstick(!): SMALL [CG]  1212 Leukocytes, UA(!): TRACE [CG]  1212 Hgb urine dipstick(!): SMALL [CG]  1212 Leukocytes, UA(!): TRACE [CG]  1506 IMPRESSION: 1. Complex peripherally enhancing fluid collection centered about the left adnexa and uterus may be loculated. The largest component measures 5.8 x 4.2 x 2.7 cm. This most likely represents a complicated tubo-ovarian abscess. Complicated sigmoid colitis is considered less likely without other inflammatory changes about the colon. The colon appears to be collapsed and passes adjacent to the collection. 2. 12 mm calcification in the left adnexa likely represents a dystrophic left ovarian calcification. 3. Mild dilation of distal small bowel adjacent to the collection likely represents ileus and secondary inflammation. 4. Edema is present in the distal small bowel mesentery  adjacent to the collection. 5. Probable hepatic steatosis. 6. Bilateral renal cysts appear benign.  These results were called by telephone at the time of interpretation on 10/10/2017 at 2:42 pm to [facility], who verbally acknowledged these results.   Electronically Signed By: San Morelle M.D. On: 10/10/2017 14:48    CT ABDOMEN PELVIS W CONTRAST [CG]  1726 IMPRESSION: 1. Negative for ovarian torsion. 2. 9 cm complex mass within the left adnexa and adjacent to the left ovary, extending superior to the left aspect of uterus. Findings could be secondary to tubo-ovarian abscess in the correct clinical setting. Note that the complex mass cannot be distinguished from adjacent sigmoid colon on the CT and bowel perforation with abscess/secondary involvement of the left adnexa is also included in the differential.     US Pelvis Complete [  CG]  1733 Spoke to OBGYN (Constant), recommends gen surgery consult first. Call back as needed. Gen surgery consult ordered.    [CG]  23 Spoke to Dr. Brantley Stage with general surgery, he favors pelvic abscess recommends admission for IV and IR consult for drain tube.    [CG]    Clinical Course User Index [CG] Kinnie Feil, PA-C    1326: WBC 24.1. Alk phos 334, ALT 80, total bili 1.4. Small hgb, trace leuks in UA without nitrites, bacteria.  Mild tachycardia noted, without fever. SIRS criteria met however pt is well appearing, afebrile. Considering early sepsis.  Will hold off on abx at this time. Blood culture sent. Dx includes appendicitis > cholecystitis > pancreatitis > pyelo less likely.  CT AP pending. She declined antiemetics and analgesics currently.   1540: CT AP concerning for TOA vs complicated sigmoid colitis.  Discussed results with pt. She confirms monogamous relationship with husband. No h/o PID, STDs. No vaginal dc, dysuria, hematuria. Attempted to perform pelvic, pt in [place].   1820: UA concerning for mas vs  colitis/bowel perf.  Discussed with OBGYN and general surgery.  Dr. Brantley Stage recommends admission for drainage by IR.  Consult unassigned for SIRS response, pelvic abscess.  Final Clinical Impressions(s) / ED Diagnoses   Final diagnoses:  SIRS (systemic inflammatory response syndrome) ([place])  Pelvic abscess in female    ED Discharge Orders    None       Arlean Hopping 10/10/17 1831    Drenda Freeze, MD 10/11/17 [phone]

## 2017-10-10 NOTE — H&P (Signed)
History and Physical   Angel Anderson ZOX:[id number] DOB: November 25, 1975 DOA: 10/10/2017  Referring MD/NP/PA: Dr. Silverio Lay  PCP: Patient, No Pcp Per   Outpatient Specialists: None  Patient coming from: Home  Chief Complaint: Abdominal pain  HPI: Angel Anderson is a 42 y.o. female with medical history significant of kidney stones otherwise no other significant history presenting to the ER with sudden onset of abdominal pain about a week ago rated as 8 out of 10.  Pain is described as crampy intermittent and diffuse in the lower abdomen to the pelvic area.  Patient has noticed this during urination as well as eating.  But the pain is now persistent and not related to any activity.  She did have urinary frequency but no discharge.  She has had BTL and C-section in the past.  Denied any high risk sexual encounters.  She is married with one partner.  Patient denied any other GI symptoms or history of any GI disease.  She was given Macrobid for the last 2 days for possible UTI.  She was seen at that point at urgent care center with blood drawn white count coming back as 24,000.  Patient came to the ER where she was evaluated and now found to have findings in the pelvic consistent with pelvic abscess.  She has been evaluated by surgery.  Patient is being admitted for treatment of her pelvic abscess including possible percutaneous drainage..  ED Course: Patient's temperature is 100.6 with blood pressure 147/84 pulse 107 respiratory rate of 18 oxygen sats 100% room air.  White count is 26.6 thousand with a left shift.  Hemoglobin 11.8 with chloride 98 and normal sodium.  Glucose is 132.  Lactic acid 1.5.  CT abdomen pelvis showed complex peripherally enhancing fluid collection about 5.8 x 4.2 x 2.7 cm in the left adnexa and uterus.  Suspected tubo-ovarian abscess which was complicated.  Also 12 mm calcification in the left adnexa and possible ileus in the area.  Pelvic ultrasound including transvaginal ultrasound  confirmed a 9 cm complex collection of pelvic abscess.  OB/GYN was initially consulted and deferred to general surgery who has evaluated patient and recommended medical admission with IV antibiotics and percutaneous drainage.  Review of Systems: As per HPI otherwise 10 point review of systems negative.    History reviewed. No pertinent past medical history.  History reviewed. No pertinent surgical history.   reports that she has never smoked. She has never used smokeless tobacco. She reports that she does not drink alcohol. Her drug history is not on file.  No Known Allergies  History reviewed. No pertinent family history.   Prior to Admission medications   Medication Sig Start Date End Date Taking? Authorizing Provider  Multiple Vitamins-Minerals (MULTIVITAMIN WITH MINERALS) tablet Take 1 tablet by mouth daily.   Yes [provider]  nitrofurantoin, macrocrystal-monohydrate, (MACROBID) 100 MG capsule Take 100 mg by mouth 2 (two) times daily. x10 days   Yes [provider]  vitamin E 400 UNIT capsule Take 400 Units by mouth daily.   Yes [provider]    Physical Exam: Vitals:   10/10/17 1345 10/10/17 1530 10/10/17 1530 10/10/17 1700  BP: 125/90 127/88 (!) 132/92 127/80  Pulse:  (!) 104 99 (!) 101  Resp:   18   Temp:   (!) 100.6 F (38.1 C)   TempSrc:      SpO2:  99%  100%  Weight:      Height:  Constitutional: NAD, calm, comfortable Vitals:   10/10/17 1345 10/10/17 1530 10/10/17 1530 10/10/17 1700  BP: 125/90 127/88 (!) 132/92 127/80  Pulse:  (!) 104 99 (!) 101  Resp:   18   Temp:   (!) 100.6 F (38.1 C)   TempSrc:      SpO2:  99%  100%  Weight:      Height:       Eyes: PERRL, lids and conjunctivae normal ENMT: Mucous membranes are moist. Posterior pharynx clear of any exudate or lesions.Normal dentition.  Neck: normal, supple, no masses, no thyromegaly Respiratory: clear to auscultation bilaterally, no wheezing, no crackles.  Normal respiratory effort. No accessory muscle use.  Cardiovascular: Regular rate and rhythm, no murmurs / rubs / gallops. No extremity edema. 2+ pedal pulses. No carotid bruits.  Abdomen: Diffuse lower abdominal tenderness, no masses palpated. No hepatosplenomegaly. Bowel sounds positive.  Musculoskeletal: no clubbing / cyanosis. No joint deformity upper and lower extremities. Good ROM, no contractures. Normal muscle tone.  Skin: no rashes, lesions, ulcers. No induration Neurologic: CN 2-12 grossly intact. Sensation intact, DTR normal. Strength 5/5 in all 4.  Psychiatric: Normal judgment and insight. Alert and oriented x 3. Normal mood.     Labs on Admission: I have personally reviewed following labs and imaging studies  CBC: Recent Labs  Lab 10/10/17 0954  WBC 24.1*  HGB 12.6  HCT 38.2  MCV 89.5  PLT 419*   Basic Metabolic Panel: Recent Labs  Lab 10/10/17 0954  NA 137  K 4.2  CL 96*  CO2 26  GLUCOSE 132*  BUN <5*  CREATININE 0.83  CALCIUM 9.7   GFR: Estimated Creatinine Clearance: 80.3 mL/min (by C-G formula based on SCr of 0.83 mg/dL). Liver Function Tests: Recent Labs  Lab 10/10/17 0954  AST 41  ALT 80*  ALKPHOS 334*  BILITOT 1.4*  PROT 8.1  ALBUMIN 3.2*   Recent Labs  Lab 10/10/17 0954  LIPASE 94*   No results for input(s): AMMONIA in the last 168 hours. Coagulation Profile: No results for input(s): INR, PROTIME in the last 168 hours. Cardiac Enzymes: No results for input(s): CKTOTAL, CKMB, CKMBINDEX, TROPONINI in the last 168 hours. BNP (last 3 results) No results for input(s): PROBNP in the last 8760 hours. HbA1C: No results for input(s): HGBA1C in the last 72 hours. CBG: No results for input(s): GLUCAP in the last 168 hours. Lipid Profile: No results for input(s): CHOL, HDL, LDLCALC, TRIG, CHOLHDL, LDLDIRECT in the last 72 hours. Thyroid Function Tests: No results for input(s): TSH, T4TOTAL, FREET4, T3FREE, THYROIDAB in the last 72  hours. Anemia Panel: No results for input(s): VITAMINB12, FOLATE, FERRITIN, TIBC, IRON, RETICCTPCT in the last 72 hours. Urine analysis:    Component Value Date/Time   COLORURINE YELLOW 10/10/2017 0950   APPEARANCEUR CLEAR 10/10/2017 0950   LABSPEC 1.008 10/10/2017 0950   PHURINE 7.0 10/10/2017 0950   GLUCOSEU NEGATIVE 10/10/2017 0950   HGBUR SMALL (A) 10/10/2017 0950   BILIRUBINUR NEGATIVE 10/10/2017 0950   KETONESUR NEGATIVE 10/10/2017 0950   PROTEINUR NEGATIVE 10/10/2017 0950   NITRITE NEGATIVE 10/10/2017 0950   LEUKOCYTESUR TRACE (A) 10/10/2017 0950   Sepsis Labs: @LABRCNTIP (procalcitonin:4,lacticidven:4) ) Recent Results (from the past 240 hour(s))  Wet prep, genital     Status: Abnormal   Collection Time: 10/10/17  5:57 PM  Result Value Ref Range Status   Yeast Wet Prep HPF POC NONE SEEN NONE SEEN Final   Trich, Wet Prep NONE SEEN NONE SEEN Final  Clue Cells Wet Prep HPF POC NONE SEEN NONE SEEN Final   WBC, Wet Prep HPF POC MANY (A) NONE SEEN Final   Sperm NONE SEEN  Final    Comment: Performed at [facility], [address], [place], [place] [address]     Radiological Exams on Admission: US Transvaginal Non-ob  Result Date: 10/10/2017 CLINICAL DATA:  Pelvic pain EXAM: TRANSABDOMINAL AND TRANSVAGINAL ULTRASOUND OF PELVIS DOPPLER ULTRASOUND OF OVARIES TECHNIQUE: Both transabdominal and transvaginal ultrasound examinations of the pelvis were performed. Transabdominal technique was performed for global imaging of the pelvis including uterus, ovaries, adnexal regions, and pelvic cul-de-sac. It was necessary to proceed with endovaginal exam following the transabdominal exam to visualize the uterus endometrium and ovaries. Color and duplex Doppler ultrasound was utilized to evaluate blood flow to the ovaries. COMPARISON:  CT 10/10/2017 FINDINGS: Uterus Measurements: 7.6 x 4.6 x 5 cm. No fibroids or other mass visualized. Endometrium Thickness: 6.6 mm.  No focal abnormality  visualized. Right ovary Measurements: 3.6 x 1.6 x 2.8 cm. Small septated cyst versus adjacent cysts measuring 1.8 cm. Left ovary Measurements: 3.9 x 2.9 x 3.6 cm within the left adnexa, adjacent and possibly involving left ovary and extending over the superior aspect of left uterus is a large complex mass measuring 9 x 5.5 x 8.8 cm, corresponding to complex fluid collection on CT. 1.4 cm echogenic calcification at the left adnexa corresponding to CT calcification. Pulsed Doppler evaluation of both ovaries demonstrates normal low-resistance arterial and venous waveforms. Other findings Trace free fluid IMPRESSION: 1. Negative for ovarian torsion. 2. 9 cm complex mass within the left adnexa and adjacent to the left ovary, extending superior to the left aspect of uterus. Findings could be secondary to tubo-ovarian abscess in the correct clinical setting. Note that the complex mass cannot be distinguished from adjacent sigmoid colon on the CT and bowel perforation with abscess/secondary involvement of the left adnexa is also included in the differential. Electronically Signed   By: Jasmine Pang M.D.   On: 10/10/2017 16:58   US Pelvis Complete  Result Date: 10/10/2017 CLINICAL DATA:  Pelvic pain EXAM: TRANSABDOMINAL AND TRANSVAGINAL ULTRASOUND OF PELVIS DOPPLER ULTRASOUND OF OVARIES TECHNIQUE: Both transabdominal and transvaginal ultrasound examinations of the pelvis were performed. Transabdominal technique was performed for global imaging of the pelvis including uterus, ovaries, adnexal regions, and pelvic cul-de-sac. It was necessary to proceed with endovaginal exam following the transabdominal exam to visualize the uterus endometrium and ovaries. Color and duplex Doppler ultrasound was utilized to evaluate blood flow to the ovaries. COMPARISON:  CT 10/10/2017 FINDINGS: Uterus Measurements: 7.6 x 4.6 x 5 cm. No fibroids or other mass visualized. Endometrium Thickness: 6.6 mm.  No focal abnormality visualized. Right  ovary Measurements: 3.6 x 1.6 x 2.8 cm. Small septated cyst versus adjacent cysts measuring 1.8 cm. Left ovary Measurements: 3.9 x 2.9 x 3.6 cm within the left adnexa, adjacent and possibly involving left ovary and extending over the superior aspect of left uterus is a large complex mass measuring 9 x 5.5 x 8.8 cm, corresponding to complex fluid collection on CT. 1.4 cm echogenic calcification at the left adnexa corresponding to CT calcification. Pulsed Doppler evaluation of both ovaries demonstrates normal low-resistance arterial and venous waveforms. Other findings Trace free fluid IMPRESSION: 1. Negative for ovarian torsion. 2. 9 cm complex mass within the left adnexa and adjacent to the left ovary, extending superior to the left aspect of uterus. Findings could be secondary to tubo-ovarian abscess in  the correct clinical setting. Note that the complex mass cannot be distinguished from adjacent sigmoid colon on the CT and bowel perforation with abscess/secondary involvement of the left adnexa is also included in the differential. Electronically Signed   By: Jasmine Pang M.D.   On: 10/10/2017 16:58   Ct Abdomen Pelvis W Contrast  Result Date: 10/10/2017 CLINICAL DATA:  Leukocytosis. Right lower quadrant and upper quadrant pain. EXAM: CT ABDOMEN AND PELVIS WITH CONTRAST TECHNIQUE: Multidetector CT imaging of the abdomen and pelvis was performed using the standard protocol following bolus administration of intravenous contrast. CONTRAST:  ISOVUE-300 IOPAMIDOL (ISOVUE-300) INJECTION 61% COMPARISON:  None. FINDINGS: Lower chest: Left basilar linear atelectasis or scarring is present. The heart size is normal. No significant pleural or pericardial effusion is present. Hepatobiliary: Fatty infiltration of the liver is suggested. No discrete lesions are present. There is no biliary dilation. Common bile duct is normal. Pancreas: Unremarkable. No pancreatic ductal dilatation or surrounding inflammatory changes.  Spleen: Normal in size without focal abnormality. Adrenals/Urinary Tract: Adrenal glands are normal. A 12 mm simple cyst is present in the midportion of the left kidney. A second cyst measures 6 mm. Two subcentimeter cysts are present at the lower pole of the right kidney. Ureters are within normal limits bilaterally. The urinary bladder is normal. Stomach/Bowel: The stomach and duodenum are within normal limits. There is mild dilation of distal small bowel, likely ileus adjacent to the peripherally enhancing abscess. No obstruction is present. Terminal ileum is somewhat thickened. The appendix is visualized and within normal limits. The ascending and transverse colon are within normal limits. The proximal descending colon is mostly collapsed. The sigmoid colon passes immediately adjacent to the peripherally enhancing collection. Vascular/Lymphatic: Subcentimeter left iliac nodes are likely reactive. No significant adenopathy is present. Reproductive: The uterus is somewhat edematous. A multiloculated peripherally enhancing fluid collection lies immediately superior to the uterus and about the left adnexa. The largest component measures 5.8 x 4.2 x 3.2 cm. A 12 mm calcification is also present in the left adnexa. The right adnexa is unremarkable. Other: No layering free fluid is present. There is edema in the distal small bowel mesentery adjacent to the collection. No free air is present. Musculoskeletal: Vertebral body heights are normal. No focal lytic or blastic lesions are present. The bony pelvis is intact. The hips are located and within normal limits. IMPRESSION: 1. Complex peripherally enhancing fluid collection centered about the left adnexa and uterus may be loculated. The largest component measures 5.8 x 4.2 x 2.7 cm. This most likely represents a complicated tubo-ovarian abscess. Complicated sigmoid colitis is considered less likely without other inflammatory changes about the colon. The colon appears to  be collapsed and passes adjacent to the collection. 2. 12 mm calcification in the left adnexa likely represents a dystrophic left ovarian calcification. 3. Mild dilation of distal small bowel adjacent to the collection likely represents ileus and secondary inflammation. 4. Edema is present in the distal small bowel mesentery adjacent to the collection. 5. Probable hepatic steatosis. 6. Bilateral renal cysts appear benign. These results were called by telephone at the time of interpretation on 10/10/2017 at 2:42 pm to [facility], who verbally acknowledged these results. Electronically Signed   By: Marin Roberts M.D.   On: 10/10/2017 14:48   [place] Art/ven Flow Abd Pelv Doppler  Result Date: 10/10/2017 CLINICAL DATA:  Pelvic pain EXAM: TRANSABDOMINAL AND TRANSVAGINAL ULTRASOUND OF PELVIS DOPPLER ULTRASOUND OF OVARIES TECHNIQUE: Both transabdominal and transvaginal ultrasound examinations of  the pelvis were performed. Transabdominal technique was performed for global imaging of the pelvis including uterus, ovaries, adnexal regions, and pelvic cul-de-sac. It was necessary to proceed with endovaginal exam following the transabdominal exam to visualize the uterus endometrium and ovaries. Color and duplex Doppler ultrasound was utilized to evaluate blood flow to the ovaries. COMPARISON:  CT 10/10/2017 FINDINGS: Uterus Measurements: 7.6 x 4.6 x 5 cm. No fibroids or other mass visualized. Endometrium Thickness: 6.6 mm.  No focal abnormality visualized. Right ovary Measurements: 3.6 x 1.6 x 2.8 cm. Small septated cyst versus adjacent cysts measuring 1.8 cm. Left ovary Measurements: 3.9 x 2.9 x 3.6 cm within the left adnexa, adjacent and possibly involving left ovary and extending over the superior aspect of left uterus is a large complex mass measuring 9 x 5.5 x 8.8 cm, corresponding to complex fluid collection on CT. 1.4 cm echogenic calcification at the left adnexa corresponding to CT calcification. Pulsed Doppler  evaluation of both ovaries demonstrates normal low-resistance arterial and venous waveforms. Other findings Trace free fluid IMPRESSION: 1. Negative for ovarian torsion. 2. 9 cm complex mass within the left adnexa and adjacent to the left ovary, extending superior to the left aspect of uterus. Findings could be secondary to tubo-ovarian abscess in the correct clinical setting. Note that the complex mass cannot be distinguished from adjacent sigmoid colon on the CT and bowel perforation with abscess/secondary involvement of the left adnexa is also included in the differential. Electronically Signed   By: Jasmine Pang M.D.   On: 10/10/2017 16:58      Assessment/Plan Principal Problem:   Pelvic abscess in female Active Problems:   Leucocytosis     #1 complex pelvic abscess: The source is not entirely clear.  Patient has had vaginal exam with wet prep as well as ultrasound and CT scan.  So far no evidence of STD as a cause although GC and Chlamydia still pending.  Patient will be initiated on IV cefepime, Vanco and then doxycycline empirically for possible STDs.  Agree with percutaneous drainage of the abscess.  #2 leukocytosis: Most likely secondary to her pelvic abscess.  Patient otherwise is not septic.  Continue with pain management as well as monitoring her white count.   DVT prophylaxis: Lovenox  Code Status: Full code  Family Communication: Husband  Disposition Plan: Home  Consults called: General Surgery  Admission status: Inpatient  Severity of Illness: The appropriate patient status for this patient is INPATIENT. Inpatient status is judged to be reasonable and necessary in order to provide the required intensity of service to ensure the patient's safety. The patient's presenting symptoms, physical exam findings, and initial radiographic and laboratory data in the context of their chronic comorbidities is felt to place them at high risk for further clinical deterioration. Furthermore,  it is not anticipated that the patient will be medically stable for discharge from the hospital within 2 midnights of admission. The following factors support the patient status of inpatient.   " The patient's presenting symptoms include abdominal pain. " The worrisome physical exam findings include tenderness in the lower abdomen and pelvic area. " The initial radiographic and laboratory data are worrisome because of significant leukocytosis. " The chronic co-morbidities include no significant comorbidities.   * I certify that at the point of admission it is my clinical judgment that the patient will require inpatient hospital care spanning beyond 2 midnights from the point of admission due to high intensity of service, high risk for further deterioration and  high frequency of surveillance required.Lonia Blood MD [facility] Pager [phone]  If 7PM-7AM, please contact night-coverage [email or web address] Password [facility]  10/10/2017, 7:35 PM

## 2017-10-11 ENCOUNTER — Inpatient Hospital Stay (HOSPITAL_COMMUNITY): Payer: 59

## 2017-10-11 ENCOUNTER — Encounter (HOSPITAL_COMMUNITY): Payer: Self-pay | Admitting: Physician Assistant

## 2017-10-11 DIAGNOSIS — N739 Female pelvic inflammatory disease, unspecified: Secondary | ICD-10-CM

## 2017-10-11 DIAGNOSIS — D649 Anemia, unspecified: Secondary | ICD-10-CM

## 2017-10-11 LAB — COMPREHENSIVE METABOLIC PANEL
ALT: 53 U/L — AB (ref 0–44)
AST: 29 U/L (ref 15–41)
Albumin: 2.4 g/dL — ABNORMAL LOW (ref 3.5–5.0)
Alkaline Phosphatase: 265 U/L — ABNORMAL HIGH (ref 38–126)
Anion gap: 10 (ref 5–15)
BUN: 5 mg/dL — AB (ref 6–20)
CHLORIDE: 105 mmol/L (ref 98–111)
CO2: 24 mmol/L (ref 22–32)
CREATININE: 0.72 mg/dL (ref 0.44–1.00)
Calcium: 8.5 mg/dL — ABNORMAL LOW (ref 8.9–10.3)
GFR calc Af Amer: >60 mL/min (ref >60)
GFR calc non Af Amer: >60 mL/min (ref >60)
GLUCOSE: 103 mg/dL — AB (ref 70–99)
Potassium: 3.6 mmol/L (ref 3.5–5.1)
Sodium: 139 mmol/L (ref 135–145)
Total Bilirubin: 1 mg/dL (ref 0.3–1.2)
Total Protein: 6.6 g/dL (ref 6.5–8.1)

## 2017-10-11 LAB — CBC
HCT: 33.6 % — ABNORMAL LOW (ref 36.0–46.0)
Hemoglobin: 10.9 g/dL — ABNORMAL LOW (ref 12.0–15.0)
MCH: 29.1 pg (ref 26.0–34.0)
MCHC: 32.4 g/dL (ref 30.0–36.0)
MCV: 89.8 fL (ref 78.0–100.0)
PLATELETS: 352 10*3/uL (ref 150–400)
RBC: 3.74 MIL/uL — ABNORMAL LOW (ref 3.87–5.11)
RDW: 12.6 % (ref 11.5–15.5)
WBC: 18 10*3/uL — ABNORMAL HIGH (ref 4.0–10.5)

## 2017-10-11 LAB — PROTIME-INR
INR: 1.05
Prothrombin Time: 13.6 seconds (ref 11.4–15.2)

## 2017-10-11 LAB — HIV ANTIBODY (ROUTINE TESTING W REFLEX): HIV Screen 4th Generation wRfx: NONREACTIVE

## 2017-10-11 MED ORDER — IBUPROFEN 600 MG PO TABS
600.0000 mg | ORAL_TABLET | Freq: Once | ORAL | Status: AC
  Administered 2017-10-11: 600 mg via ORAL
  Filled 2017-10-11: qty 1

## 2017-10-11 MED ORDER — ENOXAPARIN SODIUM 40 MG/0.4ML ~~LOC~~ SOLN
40.0000 mg | Freq: Every day | SUBCUTANEOUS | Status: DC
  Administered 2017-10-12 – 2017-10-14 (×3): 40 mg via SUBCUTANEOUS
  Filled 2017-10-11 (×3): qty 0.4

## 2017-10-11 MED ORDER — MIDAZOLAM HCL 2 MG/2ML IJ SOLN
INTRAMUSCULAR | Status: AC
  Filled 2017-10-11: qty 4

## 2017-10-11 MED ORDER — FENTANYL CITRATE (PF) 100 MCG/2ML IJ SOLN
INTRAMUSCULAR | Status: AC
  Filled 2017-10-11: qty 4

## 2017-10-11 MED ORDER — TRAMADOL HCL 50 MG PO TABS
50.0000 mg | ORAL_TABLET | Freq: Two times a day (BID) | ORAL | Status: DC | PRN
  Administered 2017-10-11 – 2017-10-13 (×4): 50 mg via ORAL
  Filled 2017-10-11 (×4): qty 1

## 2017-10-11 MED ORDER — FENTANYL CITRATE (PF) 100 MCG/2ML IJ SOLN
INTRAMUSCULAR | Status: AC | PRN
  Administered 2017-10-11 (×2): 50 ug via INTRAVENOUS

## 2017-10-11 MED ORDER — INFLUENZA VAC SPLIT QUAD 0.5 ML IM SUSY
0.5000 mL | PREFILLED_SYRINGE | INTRAMUSCULAR | Status: AC
  Administered 2017-10-12: 0.5 mL via INTRAMUSCULAR
  Filled 2017-10-11: qty 0.5

## 2017-10-11 MED ORDER — MIDAZOLAM HCL 2 MG/2ML IJ SOLN
INTRAMUSCULAR | Status: AC | PRN
  Administered 2017-10-11 (×2): 1 mg via INTRAVENOUS

## 2017-10-11 MED ORDER — SODIUM CHLORIDE 0.9% FLUSH
5.0000 mL | Freq: Three times a day (TID) | INTRAVENOUS | Status: DC
  Administered 2017-10-11: 15:00:00
  Administered 2017-10-11 – 2017-10-14 (×8): 5 mL

## 2017-10-11 MED ORDER — LIDOCAINE HCL 1 % IJ SOLN
INTRAMUSCULAR | Status: AC
  Filled 2017-10-11: qty 20

## 2017-10-11 NOTE — H&P (Signed)
Chief Complaint: Pelvic abscess  Referring Physician(s): Rometta Emery  Supervising Physician: Gilmer Mor  Patient Status: [facility] - In-pt  History of Present Illness: Angel Anderson is a 42 y.o. female who experienced sudden onset of abdominal pain about a week ago.  She came to the ED yesterday because the pain had become persistent.  She was febrile in the ED with a temp of 100.6. WBC 26.6.  CT scan showed = Complex peripherally enhancing fluid collection centered about he left adnexa and uterus may be loculated. The largest component measures 5.8 x 4.2 x 2.7 cm.  Dr. Loreta Ave reviewed the images today and there is also an appendicolith seen which is not in the report.  We are asked to evaluate her for image guided drain placement.  She is NPO. No blood thinners.  History reviewed. No pertinent past medical history.  History reviewed. No pertinent surgical history.  Allergies: Patient has no known allergies.  Medications: Prior to Admission medications   Medication Sig Start Date End Date Taking? Authorizing Provider  Multiple Vitamins-Minerals (MULTIVITAMIN WITH MINERALS) tablet Take 1 tablet by mouth daily.   Yes [provider]  nitrofurantoin, macrocrystal-monohydrate, (MACROBID) 100 MG capsule Take 100 mg by mouth 2 (two) times daily. x10 days   Yes [provider]  vitamin E 400 UNIT capsule Take 400 Units by mouth daily.   Yes [provider]     History reviewed. No pertinent family history.  Social History   Socioeconomic History  . Marital status: Married    Spouse name: Not on file  . Number of children: Not on file  . Years of education: Not on file  . Highest education level: Not on file  Occupational History  . Not on file  Social Needs  . Financial resource strain: Not on file  . Food insecurity:    Worry: Not on file    Inability: Not on file  . Transportation needs:    Medical: Not on file   Non-medical: Not on file  Tobacco Use  . Smoking status: Never Smoker  . Smokeless tobacco: Never Used  Substance and Sexual Activity  . Alcohol use: Never    Frequency: Never  . Drug use: Not on file  . Sexual activity: Not on file  Lifestyle  . Physical activity:    Days per week: Not on file    Minutes per session: Not on file  . Stress: Not on file  Relationships  . Social connections:    Talks on phone: Not on file    Gets together: Not on file    Attends religious service: Not on file    Active member of club or organization: Not on file    Attends meetings of clubs or organizations: Not on file    Relationship status: Not on file  Other Topics Concern  . Not on file  Social History Narrative  . Not on file     Review of Systems: A 12 point ROS discussed and pertinent positives are indicated in the HPI above.  All other systems are negative.  Review of Systems  Vital Signs: BP 139/80 (BP Location: Right Arm)   Pulse 83   Temp 98.9 F (37.2 C) (Oral)   Resp 16   Ht 5\' 4"  (1.626 m)   Wt 64 kg   LMP 09/17/2017 (Within Days)   SpO2 100%   BMI 24.20 kg/m   Physical Exam  Constitutional: She is oriented  to person, place, and time. She appears well-developed.  HENT:  Head: Normocephalic and atraumatic.  Eyes: EOM are normal.  Neck: Normal range of motion.  Cardiovascular: Normal rate, regular rhythm and normal heart sounds.  Pulmonary/Chest: Effort normal and breath sounds normal. No respiratory distress.  Abdominal: There is tenderness.  Musculoskeletal: Normal range of motion.  Neurological: She is alert and oriented to person, place, and time.  Skin: Skin is warm and dry.  Psychiatric: She has a normal mood and affect. Her behavior is normal. Judgment and thought content normal.  Vitals reviewed.   Imaging: US Transvaginal Non-ob  Result Date: 10/10/2017 CLINICAL DATA:  Pelvic pain EXAM: TRANSABDOMINAL AND TRANSVAGINAL ULTRASOUND OF PELVIS DOPPLER  ULTRASOUND OF OVARIES TECHNIQUE: Both transabdominal and transvaginal ultrasound examinations of the pelvis were performed. Transabdominal technique was performed for global imaging of the pelvis including uterus, ovaries, adnexal regions, and pelvic cul-de-sac. It was necessary to proceed with endovaginal exam following the transabdominal exam to visualize the uterus endometrium and ovaries. Color and duplex Doppler ultrasound was utilized to evaluate blood flow to the ovaries. COMPARISON:  CT 10/10/2017 FINDINGS: Uterus Measurements: 7.6 x 4.6 x 5 cm. No fibroids or other mass visualized. Endometrium Thickness: 6.6 mm.  No focal abnormality visualized. Right ovary Measurements: 3.6 x 1.6 x 2.8 cm. Small septated cyst versus adjacent cysts measuring 1.8 cm. Left ovary Measurements: 3.9 x 2.9 x 3.6 cm within the left adnexa, adjacent and possibly involving left ovary and extending over the superior aspect of left uterus is a large complex mass measuring 9 x 5.5 x 8.8 cm, corresponding to complex fluid collection on CT. 1.4 cm echogenic calcification at the left adnexa corresponding to CT calcification. Pulsed Doppler evaluation of both ovaries demonstrates normal low-resistance arterial and venous waveforms. Other findings Trace free fluid IMPRESSION: 1. Negative for ovarian torsion. 2. 9 cm complex mass within the left adnexa and adjacent to the left ovary, extending superior to the left aspect of uterus. Findings could be secondary to tubo-ovarian abscess in the correct clinical setting. Note that the complex mass cannot be distinguished from adjacent sigmoid colon on the CT and bowel perforation with abscess/secondary involvement of the left adnexa is also included in the differential. Electronically Signed   By: Jasmine Pang M.D.   On: 10/10/2017 16:58   US Pelvis Complete  Result Date: 10/10/2017 CLINICAL DATA:  Pelvic pain EXAM: TRANSABDOMINAL AND TRANSVAGINAL ULTRASOUND OF PELVIS DOPPLER ULTRASOUND OF  OVARIES TECHNIQUE: Both transabdominal and transvaginal ultrasound examinations of the pelvis were performed. Transabdominal technique was performed for global imaging of the pelvis including uterus, ovaries, adnexal regions, and pelvic cul-de-sac. It was necessary to proceed with endovaginal exam following the transabdominal exam to visualize the uterus endometrium and ovaries. Color and duplex Doppler ultrasound was utilized to evaluate blood flow to the ovaries. COMPARISON:  CT 10/10/2017 FINDINGS: Uterus Measurements: 7.6 x 4.6 x 5 cm. No fibroids or other mass visualized. Endometrium Thickness: 6.6 mm.  No focal abnormality visualized. Right ovary Measurements: 3.6 x 1.6 x 2.8 cm. Small septated cyst versus adjacent cysts measuring 1.8 cm. Left ovary Measurements: 3.9 x 2.9 x 3.6 cm within the left adnexa, adjacent and possibly involving left ovary and extending over the superior aspect of left uterus is a large complex mass measuring 9 x 5.5 x 8.8 cm, corresponding to complex fluid collection on CT. 1.4 cm echogenic calcification at the left adnexa corresponding to CT calcification. Pulsed Doppler evaluation of both ovaries demonstrates normal low-resistance  arterial and venous waveforms. Other findings Trace free fluid IMPRESSION: 1. Negative for ovarian torsion. 2. 9 cm complex mass within the left adnexa and adjacent to the left ovary, extending superior to the left aspect of uterus. Findings could be secondary to tubo-ovarian abscess in the correct clinical setting. Note that the complex mass cannot be distinguished from adjacent sigmoid colon on the CT and bowel perforation with abscess/secondary involvement of the left adnexa is also included in the differential. Electronically Signed   By: Jasmine Pang M.D.   On: 10/10/2017 16:58   Ct Abdomen Pelvis W Contrast  Result Date: 10/10/2017 CLINICAL DATA:  Leukocytosis. Right lower quadrant and upper quadrant pain. EXAM: CT ABDOMEN AND PELVIS WITH  CONTRAST TECHNIQUE: Multidetector CT imaging of the abdomen and pelvis was performed using the standard protocol following bolus administration of intravenous contrast. CONTRAST:  ISOVUE-300 IOPAMIDOL (ISOVUE-300) INJECTION 61% COMPARISON:  None. FINDINGS: Lower chest: Left basilar linear atelectasis or scarring is present. The heart size is normal. No significant pleural or pericardial effusion is present. Hepatobiliary: Fatty infiltration of the liver is suggested. No discrete lesions are present. There is no biliary dilation. Common bile duct is normal. Pancreas: Unremarkable. No pancreatic ductal dilatation or surrounding inflammatory changes. Spleen: Normal in size without focal abnormality. Adrenals/Urinary Tract: Adrenal glands are normal. A 12 mm simple cyst is present in the midportion of the left kidney. A second cyst measures 6 mm. Two subcentimeter cysts are present at the lower pole of the right kidney. Ureters are within normal limits bilaterally. The urinary bladder is normal. Stomach/Bowel: The stomach and duodenum are within normal limits. There is mild dilation of distal small bowel, likely ileus adjacent to the peripherally enhancing abscess. No obstruction is present. Terminal ileum is somewhat thickened. The appendix is visualized and within normal limits. The ascending and transverse colon are within normal limits. The proximal descending colon is mostly collapsed. The sigmoid colon passes immediately adjacent to the peripherally enhancing collection. Vascular/Lymphatic: Subcentimeter left iliac nodes are likely reactive. No significant adenopathy is present. Reproductive: The uterus is somewhat edematous. A multiloculated peripherally enhancing fluid collection lies immediately superior to the uterus and about the left adnexa. The largest component measures 5.8 x 4.2 x 3.2 cm. A 12 mm calcification is also present in the left adnexa. The right adnexa is unremarkable. Other: No layering  free fluid is present. There is edema in the distal small bowel mesentery adjacent to the collection. No free air is present. Musculoskeletal: Vertebral body heights are normal. No focal lytic or blastic lesions are present. The bony pelvis is intact. The hips are located and within normal limits. IMPRESSION: 1. Complex peripherally enhancing fluid collection centered about the left adnexa and uterus may be loculated. The largest component measures 5.8 x 4.2 x 2.7 cm. This most likely represents a complicated tubo-ovarian abscess. Complicated sigmoid colitis is considered less likely without other inflammatory changes about the colon. The colon appears to be collapsed and passes adjacent to the collection. 2. 12 mm calcification in the left adnexa likely represents a dystrophic left ovarian calcification. 3. Mild dilation of distal small bowel adjacent to the collection likely represents ileus and secondary inflammation. 4. Edema is present in the distal small bowel mesentery adjacent to the collection. 5. Probable hepatic steatosis. 6. Bilateral renal cysts appear benign. These results were called by telephone at the time of interpretation on 10/10/2017 at 2:42 pm to [facility], who verbally acknowledged these results. Electronically Signed  By: Marin Roberts M.D.   On: 10/10/2017 14:48   [place] Art/ven Flow Abd Pelv Doppler  Result Date: 10/10/2017 CLINICAL DATA:  Pelvic pain EXAM: TRANSABDOMINAL AND TRANSVAGINAL ULTRASOUND OF PELVIS DOPPLER ULTRASOUND OF OVARIES TECHNIQUE: Both transabdominal and transvaginal ultrasound examinations of the pelvis were performed. Transabdominal technique was performed for global imaging of the pelvis including uterus, ovaries, adnexal regions, and pelvic cul-de-sac. It was necessary to proceed with endovaginal exam following the transabdominal exam to visualize the uterus endometrium and ovaries. Color and duplex Doppler ultrasound was utilized to evaluate blood flow to  the ovaries. COMPARISON:  CT 10/10/2017 FINDINGS: Uterus Measurements: 7.6 x 4.6 x 5 cm. No fibroids or other mass visualized. Endometrium Thickness: 6.6 mm.  No focal abnormality visualized. Right ovary Measurements: 3.6 x 1.6 x 2.8 cm. Small septated cyst versus adjacent cysts measuring 1.8 cm. Left ovary Measurements: 3.9 x 2.9 x 3.6 cm within the left adnexa, adjacent and possibly involving left ovary and extending over the superior aspect of left uterus is a large complex mass measuring 9 x 5.5 x 8.8 cm, corresponding to complex fluid collection on CT. 1.4 cm echogenic calcification at the left adnexa corresponding to CT calcification. Pulsed Doppler evaluation of both ovaries demonstrates normal low-resistance arterial and venous waveforms. Other findings Trace free fluid IMPRESSION: 1. Negative for ovarian torsion. 2. 9 cm complex mass within the left adnexa and adjacent to the left ovary, extending superior to the left aspect of uterus. Findings could be secondary to tubo-ovarian abscess in the correct clinical setting. Note that the complex mass cannot be distinguished from adjacent sigmoid colon on the CT and bowel perforation with abscess/secondary involvement of the left adnexa is also included in the differential. Electronically Signed   By: Jasmine Pang M.D.   On: 10/10/2017 16:58    Labs:  CBC: Recent Labs    10/10/17 0954 10/10/17 2003 10/11/17 0534  WBC 24.1* 26.6* 18.0*  HGB 12.6 11.8* 10.9*  HCT 38.2 35.7* 33.6*  PLT 419* 398 352    COAGS: Recent Labs    10/11/17 0929  INR 1.05    BMP: Recent Labs    10/10/17 0954 10/10/17 2003 10/11/17 0534  NA 137  --  139  K 4.2  --  3.6  CL 96*  --  105  CO2 26  --  24  GLUCOSE 132*  --  103*  BUN <5*  --  5*  CALCIUM 9.7  --  8.5*  CREATININE 0.83 0.71 0.72  GFRNONAA >60 >60 >60  GFRAA >60 >60 >60    LIVER FUNCTION TESTS: Recent Labs    10/10/17 0954 10/11/17 0534  BILITOT 1.4* 1.0  AST 41 29  ALT 80* 53*    ALKPHOS 334* 265*  PROT 8.1 6.6  ALBUMIN 3.2* 2.4*    TUMOR MARKERS: No results for input(s): AFPTM, CEA, CA199, CHROMGRNA in the last 8760 hours.  Assessment and Plan:  Complex peripherally enhancing fluid collection with largest component measureing 5.8 x 4.2 x 2.7 cm.    Will proceed with image guided drain placement today by Dr. Loreta Ave.  Risks and benefits discussed with the patient including bleeding, infection, damage to adjacent structures, bowel perforation/fistula connection, and sepsis.  All of the patient's questions were answered, patient is agreeable to proceed. Consent signed and in chart.  Thank you for this interesting consult.  I greatly enjoyed meeting Tamberlyn Anjenette Goltz and look forward to participating in their care.  A copy of this  report was sent to the requesting provider on this date.  Electronically Signed: Gwynneth Macleod, PA-C   10/11/2017, 10:41 AM      I spent a total of 40 Minutes in face to face in clinical consultation, greater than 50% of which was counseling/coordinating care for CT drain placement.

## 2017-10-11 NOTE — Progress Notes (Signed)
[facility] PROGRESS NOTE  Angel Anderson ONG:[id number] DOB: Jun 28, 1975 DOA: 10/10/2017  PCP: Patient, No Pcp Per  Brief History/Interval Summary: 42 y.o. female with medical history significant of kidney stones otherwise no other significant history presented with complains of abdominal pain ongoing for 1 week.  Evaluation in the emergency department raised concern for pelvic abscess.  General surgery was consulted.  Case was also discussed with OB/GYN who defer to general surgery.  Patient was hospitalized for further management.  Interventional radiology requested to drain the abscess.    Reason for Visit: Pelvic abscess  Consultants: General surgery.  Interventional radiology  Procedures: None yet  Antibiotics: Zosyn and doxycycline  Subjective/Interval History: Patient denies any abdominal pain per se but feels a lot of discomfort when she has to urinate.  Denies any diarrhea.  No vaginal discharge.  Her last GYN checkup was about 3 years ago when she was in [place].  Does not have a gynecologist locally.  She relocated to the [place] about 3 years ago.  ROS: Denies any nausea or vomiting  Objective:  Vital Signs  Vitals:   10/10/17 1700 10/10/17 1948 10/10/17 2000 10/11/17 0512  BP: 127/80  128/81 139/80  Pulse: (!) 101  (!) 101 83  Resp:   16 16  Temp:  98.6 F (37 C) 98.1 F (36.7 C) 98.9 F (37.2 C)  TempSrc:   Oral Oral  SpO2: 100%  99% 100%  Weight:   64 kg   Height:   5\' 4"  (1.626 m)     Intake/Output Summary (Last 24 hours) at 10/11/2017 1019 Last data filed at 10/11/2017 4401 Gross per 24 hour  Intake 2573.88 ml  Output 1 ml  Net 2572.88 ml   Filed Weights   10/10/17 0942 10/10/17 2000  Weight: 64 kg 64 kg    General appearance: alert, cooperative, appears stated age and no distress Head: Normocephalic, without obvious abnormality, atraumatic Resp: clear to auscultation bilaterally Cardio: regular rate and rhythm, S1, S2 normal, no murmur,  click, rub or gallop GI: Abdomen is soft.  Tender in the suprapubic area.  Ill-defined fullness appreciated on that area.  Tender to palpate.  No rebound rigidity or guarding. Extremities: extremities normal, atraumatic, no cyanosis or edema Pulses: 2+ and symmetric Neurologic: Awake alert.  No focal neurological deficits.  Lab Results:  Data Reviewed: I have personally reviewed following labs and imaging studies  CBC: Recent Labs  Lab 10/10/17 0954 10/10/17 2003 10/11/17 0534  WBC 24.1* 26.6* 18.0*  HGB 12.6 11.8* 10.9*  HCT 38.2 35.7* 33.6*  MCV 89.5 89.5 89.8  PLT 419* 398 352    Basic Metabolic Panel: Recent Labs  Lab 10/10/17 0954 10/10/17 2003 10/11/17 0534  NA 137  --  139  K 4.2  --  3.6  CL 96*  --  105  CO2 26  --  24  GLUCOSE 132*  --  103*  BUN <5*  --  5*  CREATININE 0.83 0.71 0.72  CALCIUM 9.7  --  8.5*    GFR: Estimated Creatinine Clearance: 79.9 mL/min (by C-G formula based on SCr of 0.72 mg/dL).  Liver Function Tests: Recent Labs  Lab 10/10/17 0954 10/11/17 0534  AST 41 29  ALT 80* 53*  ALKPHOS 334* 265*  BILITOT 1.4* 1.0  PROT 8.1 6.6  ALBUMIN 3.2* 2.4*    Recent Labs  Lab 10/10/17 0954  LIPASE 94*    Coagulation Profile: Recent Labs  Lab 10/11/17 0929  INR  1.05     Recent Results (from the past 240 hour(s))  Wet prep, genital     Status: Abnormal   Collection Time: 10/10/17  5:57 PM  Result Value Ref Range Status   Yeast Wet Prep HPF POC NONE SEEN NONE SEEN Final   Trich, Wet Prep NONE SEEN NONE SEEN Final   Clue Cells Wet Prep HPF POC NONE SEEN NONE SEEN Final   WBC, Wet Prep HPF POC MANY (A) NONE SEEN Final   Sperm NONE SEEN  Final    Comment: Performed at [facility], [address] [place], [place] [address]      Radiology Studies: US Transvaginal Non-ob  Result Date: 10/10/2017 CLINICAL DATA:  Pelvic pain EXAM: TRANSABDOMINAL AND TRANSVAGINAL ULTRASOUND OF PELVIS DOPPLER ULTRASOUND OF OVARIES  TECHNIQUE: Both transabdominal and transvaginal ultrasound examinations of the pelvis were performed. Transabdominal technique was performed for global imaging of the pelvis including uterus, ovaries, adnexal regions, and pelvic cul-de-sac. It was necessary to proceed with endovaginal exam following the transabdominal exam to visualize the uterus endometrium and ovaries. Color and duplex Doppler ultrasound was utilized to evaluate blood flow to the ovaries. COMPARISON:  CT 10/10/2017 FINDINGS: Uterus Measurements: 7.6 x 4.6 x 5 cm. No fibroids or other mass visualized. Endometrium Thickness: 6.6 mm.  No focal abnormality visualized. Right ovary Measurements: 3.6 x 1.6 x 2.8 cm. Small septated cyst versus adjacent cysts measuring 1.8 cm. Left ovary Measurements: 3.9 x 2.9 x 3.6 cm within the left adnexa, adjacent and possibly involving left ovary and extending over the superior aspect of left uterus is a large complex mass measuring 9 x 5.5 x 8.8 cm, corresponding to complex fluid collection on CT. 1.4 cm echogenic calcification at the left adnexa corresponding to CT calcification. Pulsed Doppler evaluation of both ovaries demonstrates normal low-resistance arterial and venous waveforms. Other findings Trace free fluid IMPRESSION: 1. Negative for ovarian torsion. 2. 9 cm complex mass within the left adnexa and adjacent to the left ovary, extending superior to the left aspect of uterus. Findings could be secondary to tubo-ovarian abscess in the correct clinical setting. Note that the complex mass cannot be distinguished from adjacent sigmoid colon on the CT and bowel perforation with abscess/secondary involvement of the left adnexa is also included in the differential. Electronically Signed   By: Jasmine Pang M.D.   On: 10/10/2017 16:58   US Pelvis Complete  Result Date: 10/10/2017 CLINICAL DATA:  Pelvic pain EXAM: TRANSABDOMINAL AND TRANSVAGINAL ULTRASOUND OF PELVIS DOPPLER ULTRASOUND OF OVARIES TECHNIQUE: Both  transabdominal and transvaginal ultrasound examinations of the pelvis were performed. Transabdominal technique was performed for global imaging of the pelvis including uterus, ovaries, adnexal regions, and pelvic cul-de-sac. It was necessary to proceed with endovaginal exam following the transabdominal exam to visualize the uterus endometrium and ovaries. Color and duplex Doppler ultrasound was utilized to evaluate blood flow to the ovaries. COMPARISON:  CT 10/10/2017 FINDINGS: Uterus Measurements: 7.6 x 4.6 x 5 cm. No fibroids or other mass visualized. Endometrium Thickness: 6.6 mm.  No focal abnormality visualized. Right ovary Measurements: 3.6 x 1.6 x 2.8 cm. Small septated cyst versus adjacent cysts measuring 1.8 cm. Left ovary Measurements: 3.9 x 2.9 x 3.6 cm within the left adnexa, adjacent and possibly involving left ovary and extending over the superior aspect of left uterus is a large complex mass measuring 9 x 5.5 x 8.8 cm, corresponding to complex fluid collection on CT. 1.4 cm echogenic calcification at the left adnexa  corresponding to CT calcification. Pulsed Doppler evaluation of both ovaries demonstrates normal low-resistance arterial and venous waveforms. Other findings Trace free fluid IMPRESSION: 1. Negative for ovarian torsion. 2. 9 cm complex mass within the left adnexa and adjacent to the left ovary, extending superior to the left aspect of uterus. Findings could be secondary to tubo-ovarian abscess in the correct clinical setting. Note that the complex mass cannot be distinguished from adjacent sigmoid colon on the CT and bowel perforation with abscess/secondary involvement of the left adnexa is also included in the differential. Electronically Signed   By: Jasmine Pang M.D.   On: 10/10/2017 16:58   Ct Abdomen Pelvis W Contrast  Result Date: 10/10/2017 CLINICAL DATA:  Leukocytosis. Right lower quadrant and upper quadrant pain. EXAM: CT ABDOMEN AND PELVIS WITH CONTRAST TECHNIQUE:  Multidetector CT imaging of the abdomen and pelvis was performed using the standard protocol following bolus administration of intravenous contrast. CONTRAST:  ISOVUE-300 IOPAMIDOL (ISOVUE-300) INJECTION 61% COMPARISON:  None. FINDINGS: Lower chest: Left basilar linear atelectasis or scarring is present. The heart size is normal. No significant pleural or pericardial effusion is present. Hepatobiliary: Fatty infiltration of the liver is suggested. No discrete lesions are present. There is no biliary dilation. Common bile duct is normal. Pancreas: Unremarkable. No pancreatic ductal dilatation or surrounding inflammatory changes. Spleen: Normal in size without focal abnormality. Adrenals/Urinary Tract: Adrenal glands are normal. A 12 mm simple cyst is present in the midportion of the left kidney. A second cyst measures 6 mm. Two subcentimeter cysts are present at the lower pole of the right kidney. Ureters are within normal limits bilaterally. The urinary bladder is normal. Stomach/Bowel: The stomach and duodenum are within normal limits. There is mild dilation of distal small bowel, likely ileus adjacent to the peripherally enhancing abscess. No obstruction is present. Terminal ileum is somewhat thickened. The appendix is visualized and within normal limits. The ascending and transverse colon are within normal limits. The proximal descending colon is mostly collapsed. The sigmoid colon passes immediately adjacent to the peripherally enhancing collection. Vascular/Lymphatic: Subcentimeter left iliac nodes are likely reactive. No significant adenopathy is present. Reproductive: The uterus is somewhat edematous. A multiloculated peripherally enhancing fluid collection lies immediately superior to the uterus and about the left adnexa. The largest component measures 5.8 x 4.2 x 3.2 cm. A 12 mm calcification is also present in the left adnexa. The right adnexa is unremarkable. Other: No layering free fluid is  present. There is edema in the distal small bowel mesentery adjacent to the collection. No free air is present. Musculoskeletal: Vertebral body heights are normal. No focal lytic or blastic lesions are present. The bony pelvis is intact. The hips are located and within normal limits. IMPRESSION: 1. Complex peripherally enhancing fluid collection centered about the left adnexa and uterus may be loculated. The largest component measures 5.8 x 4.2 x 2.7 cm. This most likely represents a complicated tubo-ovarian abscess. Complicated sigmoid colitis is considered less likely without other inflammatory changes about the colon. The colon appears to be collapsed and passes adjacent to the collection. 2. 12 mm calcification in the left adnexa likely represents a dystrophic left ovarian calcification. 3. Mild dilation of distal small bowel adjacent to the collection likely represents ileus and secondary inflammation. 4. Edema is present in the distal small bowel mesentery adjacent to the collection. 5. Probable hepatic steatosis. 6. Bilateral renal cysts appear benign. These results were called by telephone at the time of interpretation on 10/10/2017 at 2:42  pm to CLAUDIA GIBBONS, who verbally acknowledged these results. Electronically Signed   By: Marin Roberts M.D.   On: 10/10/2017 14:48   [place] Art/ven Flow Abd Pelv Doppler  Result Date: 10/10/2017 CLINICAL DATA:  Pelvic pain EXAM: TRANSABDOMINAL AND TRANSVAGINAL ULTRASOUND OF PELVIS DOPPLER ULTRASOUND OF OVARIES TECHNIQUE: Both transabdominal and transvaginal ultrasound examinations of the pelvis were performed. Transabdominal technique was performed for global imaging of the pelvis including uterus, ovaries, adnexal regions, and pelvic cul-de-sac. It was necessary to proceed with endovaginal exam following the transabdominal exam to visualize the uterus endometrium and ovaries. Color and duplex Doppler ultrasound was utilized to evaluate blood flow to the ovaries.  COMPARISON:  CT 10/10/2017 FINDINGS: Uterus Measurements: 7.6 x 4.6 x 5 cm. No fibroids or other mass visualized. Endometrium Thickness: 6.6 mm.  No focal abnormality visualized. Right ovary Measurements: 3.6 x 1.6 x 2.8 cm. Small septated cyst versus adjacent cysts measuring 1.8 cm. Left ovary Measurements: 3.9 x 2.9 x 3.6 cm within the left adnexa, adjacent and possibly involving left ovary and extending over the superior aspect of left uterus is a large complex mass measuring 9 x 5.5 x 8.8 cm, corresponding to complex fluid collection on CT. 1.4 cm echogenic calcification at the left adnexa corresponding to CT calcification. Pulsed Doppler evaluation of both ovaries demonstrates normal low-resistance arterial and venous waveforms. Other findings Trace free fluid IMPRESSION: 1. Negative for ovarian torsion. 2. 9 cm complex mass within the left adnexa and adjacent to the left ovary, extending superior to the left aspect of uterus. Findings could be secondary to tubo-ovarian abscess in the correct clinical setting. Note that the complex mass cannot be distinguished from adjacent sigmoid colon on the CT and bowel perforation with abscess/secondary involvement of the left adnexa is also included in the differential. Electronically Signed   By: Jasmine Pang M.D.   On: 10/10/2017 16:58     Medications:  Scheduled: . enoxaparin (LOVENOX) injection  40 mg Subcutaneous Q24H  . multivitamin with minerals  1 tablet Oral Daily  . vitamin E  400 Units Oral Daily   Continuous: . sodium chloride 100 mL/hr at 10/11/17 0650  . doxycycline (VIBRAMYCIN) IV 100 mg (10/11/17 0953)  . piperacillin-tazobactam (ZOSYN)  IV 12.5 mL/hr at 10/11/17 7614   [id number]  Assessment/Plan:    Pelvic abscess Etiology unclear but thought to be GYN source.  General surgery is following.  Patient is on IV antibiotics which will be continued.  She is on Zosyn and doxycycline.  No risk factors for her to have a STI.  HIV is  pending.  GC chlamydia probe is pending.  Patient does not have a gynecologist in town.  She is originally from [place] and relocated here about 3 years ago.  Interventional radiology consulted for draining this abscess.  WBC has improved.  She remains afebrile.  She was noted to have low-grade fever yesterday.  Elevated WBC.  Perhaps early sepsis was present.  But vital signs have been stable since then.  WBC is improving as mentioned above.  Normocytic anemia Likely due to acute illness.  No evidence of overt bleeding.  Mildly abnormal LFTs with mildly elevated ALT and alkaline phosphatase No obvious abnormalities noted on CT scan.  Continue to watch.  Mildly elevated lipase levels noted.   DVT Prophylaxis: Lovenox    Code Status: Full code Family Communication: Discussed with the patient Disposition Plan: Management as outlined above.    LOS: 1 day   Osvaldo Shipper  [facility] Pager [phone] 10/11/2017, 10:19 AM  If 7PM-7AM, please contact night-coverage at [email or web address], password [facility]

## 2017-10-11 NOTE — Plan of Care (Signed)
  Problem: Nutrition: Goal: Adequate nutrition will be maintained Outcome: Progressing   Problem: Safety: Goal: Ability to remain free from injury will improve Outcome: Progressing   Problem: Skin Integrity: Goal: Risk for impaired skin integrity will decrease Outcome: Progressing   

## 2017-10-11 NOTE — Progress Notes (Signed)
Subjective/Chief Complaint: Pain is a little better. Aggravated when she has a full bladder.    Objective: Vital signs in last 24 hours: Temp:  [98.1 F (36.7 C)-100.6 F (38.1 C)] 98.9 F (37.2 C) (09/07 0512) Pulse Rate:  [83-107] 83 (09/07 0512) Resp:  [16-18] 16 (09/07 0512) BP: (114-147)/(70-92) 139/80 (09/07 0512) SpO2:  [97 %-100 %] 100 % (09/07 0512) Weight:  [64 kg] 64 kg (09/06 2000) Last BM Date: 10/09/17  Intake/Output from previous day: 09/06 0701 - 09/07 0700 In: 2573.9 [I.V.:2058.7; IV Piggyback:515.2] Out: -  Intake/Output this shift: No intake/output data recorded.  General appearance: alert and cooperative Resp: clear to auscultation bilaterally GI: soft, nondistended, tender without guarding in lower abdomen L>R Skin: Skin color, texture, turgor normal. No rashes or lesions  Lab Results:  Recent Labs    10/10/17 2003 10/11/17 0534  WBC 26.6* 18.0*  HGB 11.8* 10.9*  HCT 35.7* 33.6*  PLT 398 352   BMET Recent Labs    10/10/17 0954 10/10/17 2003 10/11/17 0534  NA 137  --  139  K 4.2  --  3.6  CL 96*  --  105  CO2 26  --  24  GLUCOSE 132*  --  103*  BUN <5*  --  5*  CREATININE 0.83 0.71 0.72  CALCIUM 9.7  --  8.5*   PT/INR No results for input(s): LABPROT, INR in the last 72 hours. ABG No results for input(s): PHART, HCO3 in the last 72 hours.  Invalid input(s): PCO2, PO2  Studies/Results: US Transvaginal Non-ob  Result Date: 10/10/2017 CLINICAL DATA:  Pelvic pain EXAM: TRANSABDOMINAL AND TRANSVAGINAL ULTRASOUND OF PELVIS DOPPLER ULTRASOUND OF OVARIES TECHNIQUE: Both transabdominal and transvaginal ultrasound examinations of the pelvis were performed. Transabdominal technique was performed for global imaging of the pelvis including uterus, ovaries, adnexal regions, and pelvic cul-de-sac. It was necessary to proceed with endovaginal exam following the transabdominal exam to visualize the uterus endometrium and ovaries. Color and  duplex Doppler ultrasound was utilized to evaluate blood flow to the ovaries. COMPARISON:  CT 10/10/2017 FINDINGS: Uterus Measurements: 7.6 x 4.6 x 5 cm. No fibroids or other mass visualized. Endometrium Thickness: 6.6 mm.  No focal abnormality visualized. Right ovary Measurements: 3.6 x 1.6 x 2.8 cm. Small septated cyst versus adjacent cysts measuring 1.8 cm. Left ovary Measurements: 3.9 x 2.9 x 3.6 cm within the left adnexa, adjacent and possibly involving left ovary and extending over the superior aspect of left uterus is a large complex mass measuring 9 x 5.5 x 8.8 cm, corresponding to complex fluid collection on CT. 1.4 cm echogenic calcification at the left adnexa corresponding to CT calcification. Pulsed Doppler evaluation of both ovaries demonstrates normal low-resistance arterial and venous waveforms. Other findings Trace free fluid IMPRESSION: 1. Negative for ovarian torsion. 2. 9 cm complex mass within the left adnexa and adjacent to the left ovary, extending superior to the left aspect of uterus. Findings could be secondary to tubo-ovarian abscess in the correct clinical setting. Note that the complex mass cannot be distinguished from adjacent sigmoid colon on the CT and bowel perforation with abscess/secondary involvement of the left adnexa is also included in the differential. Electronically Signed   By: Jasmine Pang M.D.   On: 10/10/2017 16:58   US Pelvis Complete  Result Date: 10/10/2017 CLINICAL DATA:  Pelvic pain EXAM: TRANSABDOMINAL AND TRANSVAGINAL ULTRASOUND OF PELVIS DOPPLER ULTRASOUND OF OVARIES TECHNIQUE: Both transabdominal and transvaginal ultrasound examinations of the pelvis were performed. Transabdominal technique  was performed for global imaging of the pelvis including uterus, ovaries, adnexal regions, and pelvic cul-de-sac. It was necessary to proceed with endovaginal exam following the transabdominal exam to visualize the uterus endometrium and ovaries. Color and duplex Doppler  ultrasound was utilized to evaluate blood flow to the ovaries. COMPARISON:  CT 10/10/2017 FINDINGS: Uterus Measurements: 7.6 x 4.6 x 5 cm. No fibroids or other mass visualized. Endometrium Thickness: 6.6 mm.  No focal abnormality visualized. Right ovary Measurements: 3.6 x 1.6 x 2.8 cm. Small septated cyst versus adjacent cysts measuring 1.8 cm. Left ovary Measurements: 3.9 x 2.9 x 3.6 cm within the left adnexa, adjacent and possibly involving left ovary and extending over the superior aspect of left uterus is a large complex mass measuring 9 x 5.5 x 8.8 cm, corresponding to complex fluid collection on CT. 1.4 cm echogenic calcification at the left adnexa corresponding to CT calcification. Pulsed Doppler evaluation of both ovaries demonstrates normal low-resistance arterial and venous waveforms. Other findings Trace free fluid IMPRESSION: 1. Negative for ovarian torsion. 2. 9 cm complex mass within the left adnexa and adjacent to the left ovary, extending superior to the left aspect of uterus. Findings could be secondary to tubo-ovarian abscess in the correct clinical setting. Note that the complex mass cannot be distinguished from adjacent sigmoid colon on the CT and bowel perforation with abscess/secondary involvement of the left adnexa is also included in the differential. Electronically Signed   By: Jasmine Pang M.D.   On: 10/10/2017 16:58   Ct Abdomen Pelvis W Contrast  Result Date: 10/10/2017 CLINICAL DATA:  Leukocytosis. Right lower quadrant and upper quadrant pain. EXAM: CT ABDOMEN AND PELVIS WITH CONTRAST TECHNIQUE: Multidetector CT imaging of the abdomen and pelvis was performed using the standard protocol following bolus administration of intravenous contrast. CONTRAST:  ISOVUE-300 IOPAMIDOL (ISOVUE-300) INJECTION 61% COMPARISON:  None. FINDINGS: Lower chest: Left basilar linear atelectasis or scarring is present. The heart size is normal. No significant pleural or pericardial effusion is  present. Hepatobiliary: Fatty infiltration of the liver is suggested. No discrete lesions are present. There is no biliary dilation. Common bile duct is normal. Pancreas: Unremarkable. No pancreatic ductal dilatation or surrounding inflammatory changes. Spleen: Normal in size without focal abnormality. Adrenals/Urinary Tract: Adrenal glands are normal. A 12 mm simple cyst is present in the midportion of the left kidney. A second cyst measures 6 mm. Two subcentimeter cysts are present at the lower pole of the right kidney. Ureters are within normal limits bilaterally. The urinary bladder is normal. Stomach/Bowel: The stomach and duodenum are within normal limits. There is mild dilation of distal small bowel, likely ileus adjacent to the peripherally enhancing abscess. No obstruction is present. Terminal ileum is somewhat thickened. The appendix is visualized and within normal limits. The ascending and transverse colon are within normal limits. The proximal descending colon is mostly collapsed. The sigmoid colon passes immediately adjacent to the peripherally enhancing collection. Vascular/Lymphatic: Subcentimeter left iliac nodes are likely reactive. No significant adenopathy is present. Reproductive: The uterus is somewhat edematous. A multiloculated peripherally enhancing fluid collection lies immediately superior to the uterus and about the left adnexa. The largest component measures 5.8 x 4.2 x 3.2 cm. A 12 mm calcification is also present in the left adnexa. The right adnexa is unremarkable. Other: No layering free fluid is present. There is edema in the distal small bowel mesentery adjacent to the collection. No free air is present. Musculoskeletal: Vertebral body heights are normal. No focal lytic  or blastic lesions are present. The bony pelvis is intact. The hips are located and within normal limits. IMPRESSION: 1. Complex peripherally enhancing fluid collection centered about the left adnexa and uterus may  be loculated. The largest component measures 5.8 x 4.2 x 2.7 cm. This most likely represents a complicated tubo-ovarian abscess. Complicated sigmoid colitis is considered less likely without other inflammatory changes about the colon. The colon appears to be collapsed and passes adjacent to the collection. 2. 12 mm calcification in the left adnexa likely represents a dystrophic left ovarian calcification. 3. Mild dilation of distal small bowel adjacent to the collection likely represents ileus and secondary inflammation. 4. Edema is present in the distal small bowel mesentery adjacent to the collection. 5. Probable hepatic steatosis. 6. Bilateral renal cysts appear benign. These results were called by telephone at the time of interpretation on 10/10/2017 at 2:42 pm to [facility], who verbally acknowledged these results. Electronically Signed   By: Marin Roberts M.D.   On: 10/10/2017 14:48   [place] Art/ven Flow Abd Pelv Doppler  Result Date: 10/10/2017 CLINICAL DATA:  Pelvic pain EXAM: TRANSABDOMINAL AND TRANSVAGINAL ULTRASOUND OF PELVIS DOPPLER ULTRASOUND OF OVARIES TECHNIQUE: Both transabdominal and transvaginal ultrasound examinations of the pelvis were performed. Transabdominal technique was performed for global imaging of the pelvis including uterus, ovaries, adnexal regions, and pelvic cul-de-sac. It was necessary to proceed with endovaginal exam following the transabdominal exam to visualize the uterus endometrium and ovaries. Color and duplex Doppler ultrasound was utilized to evaluate blood flow to the ovaries. COMPARISON:  CT 10/10/2017 FINDINGS: Uterus Measurements: 7.6 x 4.6 x 5 cm. No fibroids or other mass visualized. Endometrium Thickness: 6.6 mm.  No focal abnormality visualized. Right ovary Measurements: 3.6 x 1.6 x 2.8 cm. Small septated cyst versus adjacent cysts measuring 1.8 cm. Left ovary Measurements: 3.9 x 2.9 x 3.6 cm within the left adnexa, adjacent and possibly involving left ovary  and extending over the superior aspect of left uterus is a large complex mass measuring 9 x 5.5 x 8.8 cm, corresponding to complex fluid collection on CT. 1.4 cm echogenic calcification at the left adnexa corresponding to CT calcification. Pulsed Doppler evaluation of both ovaries demonstrates normal low-resistance arterial and venous waveforms. Other findings Trace free fluid IMPRESSION: 1. Negative for ovarian torsion. 2. 9 cm complex mass within the left adnexa and adjacent to the left ovary, extending superior to the left aspect of uterus. Findings could be secondary to tubo-ovarian abscess in the correct clinical setting. Note that the complex mass cannot be distinguished from adjacent sigmoid colon on the CT and bowel perforation with abscess/secondary involvement of the left adnexa is also included in the differential. Electronically Signed   By: Jasmine Pang M.D.   On: 10/10/2017 16:58    Anti-infectives: Anti-infectives (From admission, onward)   Start     Dose/Rate Route Frequency Ordered Stop   10/11/17 0400  piperacillin-tazobactam (ZOSYN) IVPB 3.375 g     3.375 g 12.5 mL/hr over 240 Minutes Intravenous Every 8 hours 10/10/17 1956     10/10/17 2200  doxycycline (VIBRAMYCIN) 100 mg in sodium chloride 0.9 % 250 mL IVPB     100 mg 125 mL/hr over 120 Minutes Intravenous Every 12 hours 10/10/17 1953     10/10/17 2000  piperacillin-tazobactam (ZOSYN) IVPB 3.375 g     3.375 g 100 mL/hr over 30 Minutes Intravenous  Once 10/10/17 1943 10/10/17 2235   10/10/17 1530  doxycycline (VIBRAMYCIN) 100 mg in sodium chloride  0.9 % 250 mL IVPB     100 mg 125 mL/hr over 120 Minutes Intravenous  Once 10/10/17 1505 10/10/17 1856   10/10/17 1515  cefTRIAXone (ROCEPHIN) 1 g in sodium chloride 0.9 % 100 mL IVPB     1 g 200 mL/hr over 30 Minutes Intravenous  Once 10/10/17 1503 10/10/17 1652      Assessment/Plan: Pelvic abscess likely gyn etiology. Continue IV abx, perc drain pending IR eval  LOS: 1 day     Berna Bue 10/11/2017

## 2017-10-11 NOTE — Procedures (Signed)
Interventional Radiology Procedure Note  Procedure: CT guided drainage of pelvic abscess.  69F drain placed.  ~90cc purulent material aspirated.  Sample sent.  Complications: None Recommendations:  - Routine drain care, with bulb suction.  - Do not submerge  - Routine drain care   Signed,  Yvone Neu. Loreta Ave, DO

## 2017-10-12 LAB — CBC
HEMATOCRIT: 32.8 % — AB (ref 36.0–46.0)
Hemoglobin: 10.6 g/dL — ABNORMAL LOW (ref 12.0–15.0)
MCH: 29.4 pg (ref 26.0–34.0)
MCHC: 32.3 g/dL (ref 30.0–36.0)
MCV: 90.9 fL (ref 78.0–100.0)
PLATELETS: 371 10*3/uL (ref 150–400)
RBC: 3.61 MIL/uL — AB (ref 3.87–5.11)
RDW: 12.8 % (ref 11.5–15.5)
WBC: 17.8 10*3/uL — ABNORMAL HIGH (ref 4.0–10.5)

## 2017-10-12 LAB — FOLATE: FOLATE: 16.8 ng/mL (ref >5.9)

## 2017-10-12 LAB — COMPREHENSIVE METABOLIC PANEL
ALT: 42 U/L (ref 0–44)
AST: 21 U/L (ref 15–41)
Albumin: 2.4 g/dL — ABNORMAL LOW (ref 3.5–5.0)
Alkaline Phosphatase: 258 U/L — ABNORMAL HIGH (ref 38–126)
Anion gap: 9 (ref 5–15)
BILIRUBIN TOTAL: 1.1 mg/dL (ref 0.3–1.2)
BUN: 5 mg/dL — ABNORMAL LOW (ref 6–20)
CALCIUM: 8.7 mg/dL — AB (ref 8.9–10.3)
CO2: 25 mmol/L (ref 22–32)
CREATININE: 0.71 mg/dL (ref 0.44–1.00)
Chloride: 103 mmol/L (ref 98–111)
Glucose, Bld: 122 mg/dL — ABNORMAL HIGH (ref 70–99)
Potassium: 3.3 mmol/L — ABNORMAL LOW (ref 3.5–5.1)
Sodium: 137 mmol/L (ref 135–145)
TOTAL PROTEIN: 6.8 g/dL (ref 6.5–8.1)

## 2017-10-12 LAB — IRON AND TIBC
Iron: 27 ug/dL — ABNORMAL LOW (ref 28–170)
Saturation Ratios: 12 % (ref 10.4–31.8)
TIBC: 225 ug/dL — ABNORMAL LOW (ref 250–450)
UIBC: 198 ug/dL

## 2017-10-12 LAB — RETICULOCYTES
RBC.: 3.61 MIL/uL — AB (ref 3.87–5.11)
RETIC COUNT ABSOLUTE: 46.9 10*3/uL (ref 19.0–186.0)
RETIC CT PCT: 1.3 % (ref 0.4–3.1)

## 2017-10-12 LAB — FERRITIN: Ferritin: 336 ng/mL — ABNORMAL HIGH (ref 11–307)

## 2017-10-12 LAB — VITAMIN B12: VITAMIN B 12: 804 pg/mL (ref 180–914)

## 2017-10-12 MED ORDER — POTASSIUM CHLORIDE CRYS ER 20 MEQ PO TBCR
40.0000 meq | EXTENDED_RELEASE_TABLET | Freq: Once | ORAL | Status: AC
  Administered 2017-10-12: 40 meq via ORAL
  Filled 2017-10-12: qty 2

## 2017-10-12 NOTE — Progress Notes (Signed)
[facility] PROGRESS NOTE  Angel Anderson YQM:[id number] DOB: Sep 11, 1975 DOA: 10/10/2017  PCP: Patient, No Pcp Per  Brief History/Interval Summary: 42 y.o. female with medical history significant of kidney stones otherwise no other significant history presented with complains of abdominal pain ongoing for 1 week.  Evaluation in the emergency department raised concern for pelvic abscess.  General surgery was consulted.  Case was also discussed with OB/GYN who defer to general surgery.  Patient was hospitalized for further management.  Interventional radiology requested to drain the abscess.    Reason for Visit: Pelvic abscess  Consultants: General surgery.  Interventional radiology  Procedures: CT-guided drainage of the pelvic abscess 9/7  Antibiotics: Zosyn and doxycycline  Subjective/Interval History: Patient continues to have discomfort when her bladder is full and when she has to urinate.  Denies any other complaints at this time.  No nausea vomiting.  Tolerating her diet well.  Denies any diarrhea.    ROS: Denies any headaches  Objective:  Vital Signs  Vitals:   10/11/17 1420 10/11/17 2212 10/12/17 0008 10/12/17 0434  BP: 137/81 137/82  120/82  Pulse: 94 (!) 111  71  Resp: 17 16  16   Temp: 99 F (37.2 C) (!) 101.8 F (38.8 C) 99.3 F (37.4 C) 97.7 F (36.5 C)  TempSrc: Oral Oral Oral Oral  SpO2: 100% 99%  98%  Weight:      Height:        Intake/Output Summary (Last 24 hours) at 10/12/2017 0931 Last data filed at 10/12/2017 0924 Gross per 24 hour  Intake 3499.13 ml  Output 131 ml  Net 3368.13 ml   Filed Weights   10/10/17 0942 10/10/17 2000  Weight: 64 kg 64 kg    General appearance: Awake alert.  In no distress Resp: Normal effort.  Clear to auscultation bilaterally Cardio: S1-S2 is normal regular.  No S3-S4.  No rubs murmurs or bruit GI: Abdomen is soft.  Drain catheter is noted.  Mildly tender in that area.  No organomegaly appreciated.     Extremities: No edema appreciated in the lower extremities Pulses: 2+ and symmetric Neurologic: Alert and oriented x3.  No obvious focal neurological deficits.  Lab Results:  Data Reviewed: I have personally reviewed following labs and imaging studies  CBC: Recent Labs  Lab 10/10/17 0954 10/10/17 2003 10/11/17 0534 10/12/17 0513  WBC 24.1* 26.6* 18.0* 17.8*  HGB 12.6 11.8* 10.9* 10.6*  HCT 38.2 35.7* 33.6* 32.8*  MCV 89.5 89.5 89.8 90.9  PLT 419* 398 352 371    Basic Metabolic Panel: Recent Labs  Lab 10/10/17 0954 10/10/17 2003 10/11/17 0534 10/12/17 0513  NA 137  --  139 137  K 4.2  --  3.6 3.3*  CL 96*  --  105 103  CO2 26  --  24 25  GLUCOSE 132*  --  103* 122*  BUN <5*  --  5* 5*  CREATININE 0.83 0.71 0.72 0.71  CALCIUM 9.7  --  8.5* 8.7*    GFR: Estimated Creatinine Clearance: 79.9 mL/min (by C-G formula based on SCr of 0.71 mg/dL).  Liver Function Tests: Recent Labs  Lab 10/10/17 0954 10/11/17 0534 10/12/17 0513  AST 41 29 21  ALT 80* 53* 42  ALKPHOS 334* 265* 258*  BILITOT 1.4* 1.0 1.1  PROT 8.1 6.6 6.8  ALBUMIN 3.2* 2.4* 2.4*    Recent Labs  Lab 10/10/17 0954  LIPASE 94*    Coagulation Profile: Recent Labs  Lab 10/11/17 0929  INR 1.05     Recent Results (from the past 240 hour(s))  Blood culture (routine x 2)     Status: None (Preliminary result)   Collection Time: 10/10/17  1:09 PM  Result Value Ref Range Status   Specimen Description BLOOD LEFT ANTECUBITAL  Final   Special Requests   Final    BOTTLES DRAWN AEROBIC ONLY Blood Culture results may not be optimal due to an inadequate volume of blood received in culture bottles   Culture   Final    NO GROWTH 2 DAYS Performed at [facility], [address], [place], [place] [address]    Report Status PENDING  Incomplete  Blood culture (routine x 2)     Status: None (Preliminary result)   Collection Time: 10/10/17  1:15 PM  Result Value Ref Range Status   Specimen  Description BLOOD RIGHT ANTECUBITAL  Final   Special Requests   Final    BOTTLES DRAWN AEROBIC AND ANAEROBIC Blood Culture results may not be optimal due to an excessive volume of blood received in culture bottles   Culture   Final    NO GROWTH 2 DAYS Performed at [facility], [address], [place], [place] [address]    Report Status PENDING  Incomplete  Wet prep, genital     Status: Abnormal   Collection Time: 10/10/17  5:57 PM  Result Value Ref Range Status   Yeast Wet Prep HPF POC NONE SEEN NONE SEEN Final   Trich, Wet Prep NONE SEEN NONE SEEN Final   Clue Cells Wet Prep HPF POC NONE SEEN NONE SEEN Final   WBC, Wet Prep HPF POC MANY (A) NONE SEEN Final   Sperm NONE SEEN  Final    Comment: Performed at [facility], [address], [place], [place] [address]  Aerobic/Anaerobic Culture (surgical/deep wound)     Status: None (Preliminary result)   Collection Time: 10/11/17 12:53 PM  Result Value Ref Range Status   Specimen Description ABSCESS ABDOMEN  Final   Special Requests POSSIBLE TUBOVARIAN ABSCESS OR RUPTURED APPY  Final   Gram Stain   Final    ABUNDANT WBC PRESENT, PREDOMINANTLY PMN ABUNDANT GRAM POSITIVE COCCI IN PAIRS IN CHAINS Performed at [facility], [address] [place], [place] [address]    Culture PENDING  Incomplete   Report Status PENDING  Incomplete      Radiology Studies: US Transvaginal Non-ob  Result Date: 10/10/2017 CLINICAL DATA:  Pelvic pain EXAM: TRANSABDOMINAL AND TRANSVAGINAL ULTRASOUND OF PELVIS DOPPLER ULTRASOUND OF OVARIES TECHNIQUE: Both transabdominal and transvaginal ultrasound examinations of the pelvis were performed. Transabdominal technique was performed for global imaging of the pelvis including uterus, ovaries, adnexal regions, and pelvic cul-de-sac. It was necessary to proceed with endovaginal exam following the transabdominal exam to visualize the uterus endometrium and ovaries. Color and duplex Doppler ultrasound was  utilized to evaluate blood flow to the ovaries. COMPARISON:  CT 10/10/2017 FINDINGS: Uterus Measurements: 7.6 x 4.6 x 5 cm. No fibroids or other mass visualized. Endometrium Thickness: 6.6 mm.  No focal abnormality visualized. Right ovary Measurements: 3.6 x 1.6 x 2.8 cm. Small septated cyst versus adjacent cysts measuring 1.8 cm. Left ovary Measurements: 3.9 x 2.9 x 3.6 cm within the left adnexa, adjacent and possibly involving left ovary and extending over the superior aspect of left uterus is a large complex mass measuring 9 x 5.5 x 8.8 cm, corresponding to complex fluid collection on CT. 1.4  cm echogenic calcification at the left adnexa corresponding to CT calcification. Pulsed Doppler evaluation of both ovaries demonstrates normal low-resistance arterial and venous waveforms. Other findings Trace free fluid IMPRESSION: 1. Negative for ovarian torsion. 2. 9 cm complex mass within the left adnexa and adjacent to the left ovary, extending superior to the left aspect of uterus. Findings could be secondary to tubo-ovarian abscess in the correct clinical setting. Note that the complex mass cannot be distinguished from adjacent sigmoid colon on the CT and bowel perforation with abscess/secondary involvement of the left adnexa is also included in the differential. Electronically Signed   By: Jasmine Pang M.D.   On: 10/10/2017 16:58   US Pelvis Complete  Result Date: 10/10/2017 CLINICAL DATA:  Pelvic pain EXAM: TRANSABDOMINAL AND TRANSVAGINAL ULTRASOUND OF PELVIS DOPPLER ULTRASOUND OF OVARIES TECHNIQUE: Both transabdominal and transvaginal ultrasound examinations of the pelvis were performed. Transabdominal technique was performed for global imaging of the pelvis including uterus, ovaries, adnexal regions, and pelvic cul-de-sac. It was necessary to proceed with endovaginal exam following the transabdominal exam to visualize the uterus endometrium and ovaries. Color and duplex Doppler ultrasound was utilized to  evaluate blood flow to the ovaries. COMPARISON:  CT 10/10/2017 FINDINGS: Uterus Measurements: 7.6 x 4.6 x 5 cm. No fibroids or other mass visualized. Endometrium Thickness: 6.6 mm.  No focal abnormality visualized. Right ovary Measurements: 3.6 x 1.6 x 2.8 cm. Small septated cyst versus adjacent cysts measuring 1.8 cm. Left ovary Measurements: 3.9 x 2.9 x 3.6 cm within the left adnexa, adjacent and possibly involving left ovary and extending over the superior aspect of left uterus is a large complex mass measuring 9 x 5.5 x 8.8 cm, corresponding to complex fluid collection on CT. 1.4 cm echogenic calcification at the left adnexa corresponding to CT calcification. Pulsed Doppler evaluation of both ovaries demonstrates normal low-resistance arterial and venous waveforms. Other findings Trace free fluid IMPRESSION: 1. Negative for ovarian torsion. 2. 9 cm complex mass within the left adnexa and adjacent to the left ovary, extending superior to the left aspect of uterus. Findings could be secondary to tubo-ovarian abscess in the correct clinical setting. Note that the complex mass cannot be distinguished from adjacent sigmoid colon on the CT and bowel perforation with abscess/secondary involvement of the left adnexa is also included in the differential. Electronically Signed   By: Jasmine Pang M.D.   On: 10/10/2017 16:58   Ct Abdomen Pelvis W Contrast  Result Date: 10/10/2017 CLINICAL DATA:  Leukocytosis. Right lower quadrant and upper quadrant pain. EXAM: CT ABDOMEN AND PELVIS WITH CONTRAST TECHNIQUE: Multidetector CT imaging of the abdomen and pelvis was performed using the standard protocol following bolus administration of intravenous contrast. CONTRAST:  ISOVUE-300 IOPAMIDOL (ISOVUE-300) INJECTION 61% COMPARISON:  None. FINDINGS: Lower chest: Left basilar linear atelectasis or scarring is present. The heart size is normal. No significant pleural or pericardial effusion is present. Hepatobiliary: Fatty  infiltration of the liver is suggested. No discrete lesions are present. There is no biliary dilation. Common bile duct is normal. Pancreas: Unremarkable. No pancreatic ductal dilatation or surrounding inflammatory changes. Spleen: Normal in size without focal abnormality. Adrenals/Urinary Tract: Adrenal glands are normal. A 12 mm simple cyst is present in the midportion of the left kidney. A second cyst measures 6 mm. Two subcentimeter cysts are present at the lower pole of the right kidney. Ureters are within normal limits bilaterally. The urinary bladder is normal. Stomach/Bowel: The stomach and duodenum are within normal limits. There is  mild dilation of distal small bowel, likely ileus adjacent to the peripherally enhancing abscess. No obstruction is present. Terminal ileum is somewhat thickened. The appendix is visualized and within normal limits. The ascending and transverse colon are within normal limits. The proximal descending colon is mostly collapsed. The sigmoid colon passes immediately adjacent to the peripherally enhancing collection. Vascular/Lymphatic: Subcentimeter left iliac nodes are likely reactive. No significant adenopathy is present. Reproductive: The uterus is somewhat edematous. A multiloculated peripherally enhancing fluid collection lies immediately superior to the uterus and about the left adnexa. The largest component measures 5.8 x 4.2 x 3.2 cm. A 12 mm calcification is also present in the left adnexa. The right adnexa is unremarkable. Other: No layering free fluid is present. There is edema in the distal small bowel mesentery adjacent to the collection. No free air is present. Musculoskeletal: Vertebral body heights are normal. No focal lytic or blastic lesions are present. The bony pelvis is intact. The hips are located and within normal limits. IMPRESSION: 1. Complex peripherally enhancing fluid collection centered about the left adnexa and uterus may be loculated. The largest  component measures 5.8 x 4.2 x 2.7 cm. This most likely represents a complicated tubo-ovarian abscess. Complicated sigmoid colitis is considered less likely without other inflammatory changes about the colon. The colon appears to be collapsed and passes adjacent to the collection. 2. 12 mm calcification in the left adnexa likely represents a dystrophic left ovarian calcification. 3. Mild dilation of distal small bowel adjacent to the collection likely represents ileus and secondary inflammation. 4. Edema is present in the distal small bowel mesentery adjacent to the collection. 5. Probable hepatic steatosis. 6. Bilateral renal cysts appear benign. These results were called by telephone at the time of interpretation on 10/10/2017 at 2:42 pm to [facility], who verbally acknowledged these results. Electronically Signed   By: Marin Roberts M.D.   On: 10/10/2017 14:48   [place] Art/ven Flow Abd Pelv Doppler  Result Date: 10/10/2017 CLINICAL DATA:  Pelvic pain EXAM: TRANSABDOMINAL AND TRANSVAGINAL ULTRASOUND OF PELVIS DOPPLER ULTRASOUND OF OVARIES TECHNIQUE: Both transabdominal and transvaginal ultrasound examinations of the pelvis were performed. Transabdominal technique was performed for global imaging of the pelvis including uterus, ovaries, adnexal regions, and pelvic cul-de-sac. It was necessary to proceed with endovaginal exam following the transabdominal exam to visualize the uterus endometrium and ovaries. Color and duplex Doppler ultrasound was utilized to evaluate blood flow to the ovaries. COMPARISON:  CT 10/10/2017 FINDINGS: Uterus Measurements: 7.6 x 4.6 x 5 cm. No fibroids or other mass visualized. Endometrium Thickness: 6.6 mm.  No focal abnormality visualized. Right ovary Measurements: 3.6 x 1.6 x 2.8 cm. Small septated cyst versus adjacent cysts measuring 1.8 cm. Left ovary Measurements: 3.9 x 2.9 x 3.6 cm within the left adnexa, adjacent and possibly involving left ovary and extending over the  superior aspect of left uterus is a large complex mass measuring 9 x 5.5 x 8.8 cm, corresponding to complex fluid collection on CT. 1.4 cm echogenic calcification at the left adnexa corresponding to CT calcification. Pulsed Doppler evaluation of both ovaries demonstrates normal low-resistance arterial and venous waveforms. Other findings Trace free fluid IMPRESSION: 1. Negative for ovarian torsion. 2. 9 cm complex mass within the left adnexa and adjacent to the left ovary, extending superior to the left aspect of uterus. Findings could be secondary to tubo-ovarian abscess in the correct clinical setting. Note that the complex mass cannot be distinguished from adjacent sigmoid colon on the CT and  bowel perforation with abscess/secondary involvement of the left adnexa is also included in the differential. Electronically Signed   By: Jasmine Pang M.D.   On: 10/10/2017 16:58   Ct Image Guided Drainage By Percutaneous Catheter  Result Date: 10/11/2017 INDICATION: 42 year old female with a history of pelvic abscess EXAM: CT GUIDED DRAINAGE OF  ABSCESS MEDICATIONS: The patient is currently admitted to the hospital and receiving intravenous antibiotics. The antibiotics were administered within an appropriate time frame prior to the initiation of the procedure. ANESTHESIA/SEDATION: 2.0 mg IV Versed 100 mcg IV Fentanyl Moderate Sedation Time:  10 minutes The patient was continuously monitored during the procedure by the interventional radiology nurse under my direct supervision. COMPLICATIONS: None TECHNIQUE: Informed written consent was obtained from the patient after a thorough discussion of the procedural risks, benefits and alternatives. All questions were addressed. Maximal Sterile Barrier Technique was utilized including caps, mask, sterile gowns, sterile gloves, sterile drape, hand hygiene and skin antiseptic. A timeout was performed prior to the initiation of the procedure. PROCEDURE: The low abdomen/pelvis was  prepped with chlorhexidine in a sterile fashion, and a sterile drape was applied covering the operative field. A sterile gown and sterile gloves were used for the procedure. Local anesthesia was provided with 1% Lidocaine. Once the patient is prepped and draped in the usual sterile fashion. 1% lidocaine was used for local anesthesia. Trocar needle was advanced with CT guidance into the fluid collection from anterior abdominal approach. Once we confirmed location of the needle, modified Seldinger technique was used to place a 10 [place] drain into the pelvic fluid collection. Aspiration of approximately 90 cc of purulent material was performed. Catheter was sutured in position attached to bulb suction drainage. Sterile dressing was placed. Patient tolerated the procedure well and remained hemodynamically stable throughout. Final CT was performed. No complications were encountered and no significant blood loss. FINDINGS: CT demonstrates relatively persistence size of pelvic fluid collection before drainage. Status post 10 French drain placement, there is reduction in size of the abscess cavity with the drain centered within the largest component adjacent to the uterus. Note that the dystrophic calcification of the left pelvis remains in place. Uncertain if this does represent ovarian calcification, or potentially a appendicular at in the setting of ruptured appendicitis. IMPRESSION: Status post CT-guided drainage of pelvic abscess. Sample was sent to the lab for analysis. Signed, Yvone Neu. Reyne Dumas, RPVI [facility] Radiology Electronically Signed   By: Gilmer Mor D.O.   On: 10/11/2017 13:57     Medications:  Scheduled: . enoxaparin (LOVENOX) injection  40 mg Subcutaneous Daily  . Influenza vac split quadrivalent PF  0.5 mL Intramuscular Tomorrow-1000  . multivitamin with minerals  1 tablet Oral Daily  . sodium chloride flush  5 mL Intracatheter Q8H  .  vitamin E  400 Units Oral Daily   Continuous: . sodium chloride 100 mL/hr at 10/12/17 0432  . doxycycline (VIBRAMYCIN) IV 100 mg (10/11/17 2042)  . piperacillin-tazobactam (ZOSYN)  IV 3.375 g (10/12/17 0431)   ZOX:[id number], traMADol  Assessment/Plan:    Pelvic abscess with sepsis Etiology unclear but thought to be GYN source.  General surgery is following.  Patient is on IV antibiotics which will be continued.  She is on Zosyn and doxycycline.  She was also given 1 dose of ceftriaxone.  No risk factors for her to have a STI.  HIV nonreactive. GC chlamydia probe is pending.  Patient does not have a gynecologist in town.  She is originally from [place] and relocated here about 3 years ago.  That was when she was last evaluated by gynecology.  Interventional radiology was consulted.  She underwent CT-guided drainage of the abscess with catheter placement.  Gram stain is positive for gram-positive cocci in chains.  Await final identification and sensitivities.  Blood cultures are negative so far.  She did have an episode of fever last night.  Cut back on IV fluids since her oral intake is adequate.  Patient was thought to have sepsis at admission due to elevated WBC, fever, tachycardia.    Normocytic anemia Likely due to acute illness.  No evidence of overt bleeding.  Panel reviewed.  Ferritin 336.  B12 804.  Folate 16.8.  Iron 27 with a TIBC of 2.5.  Percent saturation is 12.  Mildly abnormal LFTs with mildly elevated ALT and alkaline phosphatase No obvious abnormalities noted on CT scan.  AST and ALT now normal.  Alkaline phosphatase mildly elevated.  Will need follow-up as outpatient. Mildly elevated lipase levels noted.   DVT Prophylaxis: Lovenox    Code Status: Full code Family Communication: Discussed with the patient Disposition Plan: Mobilize.  Await culture identification and sensitivities.    LOS: 2 days   Osvaldo Shipper  [facility] Pager [phone] 10/12/2017, 9:31  AM  If 7PM-7AM, please contact night-coverage at [email or web address], password [facility]

## 2017-10-12 NOTE — Plan of Care (Signed)
  Problem: Activity: Goal: Risk for activity intolerance will decrease Outcome: Progressing   Problem: Elimination: Goal: Will not experience complications related to bowel motility Outcome: Progressing   Problem: Pain Managment: Goal: General experience of comfort will improve Outcome: Progressing   

## 2017-10-12 NOTE — Progress Notes (Signed)
Subjective/Chief Complaint: Got perc drain yesterday.  Cultures pending.  Pain tolerable.  Has been ambulating.      Objective: Vital signs in last 24 hours: Temp:  [97.7 F (36.5 C)-101.8 F (38.8 C)] 97.7 F (36.5 C) (09/08 0434) Pulse Rate:  [71-111] 71 (09/08 0434) Resp:  [8-17] 16 (09/08 0434) BP: (120-151)/(65-89) 120/82 (09/08 0434) SpO2:  [98 %-100 %] 98 % (09/08 0434) Last BM Date: 10/11/17  Intake/Output from previous day: 09/07 0701 - 09/08 0700 In: 3199.1 [P.O.:520; I.V.:1984.8; IV Piggyback:684.3] Out: 131 [Urine:3; Drains:125; Stool:3] Intake/Output this shift: Total I/O In: 300 [P.O.:300] Out: -   General appearance: alert and cooperative Resp: breathing comfortably GI: soft, nondistended, minimally tender.  Drain [employer]. Skin: Skin color, texture, turgor normal. No rashes or lesions  Lab Results:  Recent Labs    10/11/17 0534 10/12/17 0513  WBC 18.0* 17.8*  HGB 10.9* 10.6*  HCT 33.6* 32.8*  PLT 352 371   BMET Recent Labs    10/11/17 0534 10/12/17 0513  NA 139 137  K 3.6 3.3*  CL 105 103  CO2 24 25  GLUCOSE 103* 122*  BUN 5* 5*  CREATININE 0.72 0.71  CALCIUM 8.5* 8.7*   PT/INR Recent Labs    10/11/17 0929  LABPROT 13.6  INR 1.05   ABG No results for input(s): PHART, HCO3 in the last 72 hours.  Invalid input(s): PCO2, PO2  Studies/Results: US Transvaginal Non-ob  Result Date: 10/10/2017 CLINICAL DATA:  Pelvic pain EXAM: TRANSABDOMINAL AND TRANSVAGINAL ULTRASOUND OF PELVIS DOPPLER ULTRASOUND OF OVARIES TECHNIQUE: Both transabdominal and transvaginal ultrasound examinations of the pelvis were performed. Transabdominal technique was performed for global imaging of the pelvis including uterus, ovaries, adnexal regions, and pelvic cul-de-sac. It was necessary to proceed with endovaginal exam following the transabdominal exam to visualize the uterus endometrium and ovaries. Color and duplex Doppler ultrasound was utilized to  evaluate blood flow to the ovaries. COMPARISON:  CT 10/10/2017 FINDINGS: Uterus Measurements: 7.6 x 4.6 x 5 cm. No fibroids or other mass visualized. Endometrium Thickness: 6.6 mm.  No focal abnormality visualized. Right ovary Measurements: 3.6 x 1.6 x 2.8 cm. Small septated cyst versus adjacent cysts measuring 1.8 cm. Left ovary Measurements: 3.9 x 2.9 x 3.6 cm within the left adnexa, adjacent and possibly involving left ovary and extending over the superior aspect of left uterus is a large complex mass measuring 9 x 5.5 x 8.8 cm, corresponding to complex fluid collection on CT. 1.4 cm echogenic calcification at the left adnexa corresponding to CT calcification. Pulsed Doppler evaluation of both ovaries demonstrates normal low-resistance arterial and venous waveforms. Other findings Trace free fluid IMPRESSION: 1. Negative for ovarian torsion. 2. 9 cm complex mass within the left adnexa and adjacent to the left ovary, extending superior to the left aspect of uterus. Findings could be secondary to tubo-ovarian abscess in the correct clinical setting. Note that the complex mass cannot be distinguished from adjacent sigmoid colon on the CT and bowel perforation with abscess/secondary involvement of the left adnexa is also included in the differential. Electronically Signed   By: Jasmine Pang M.D.   On: 10/10/2017 16:58   US Pelvis Complete  Result Date: 10/10/2017 CLINICAL DATA:  Pelvic pain EXAM: TRANSABDOMINAL AND TRANSVAGINAL ULTRASOUND OF PELVIS DOPPLER ULTRASOUND OF OVARIES TECHNIQUE: Both transabdominal and transvaginal ultrasound examinations of the pelvis were performed. Transabdominal technique was performed for global imaging of the pelvis including uterus, ovaries, adnexal regions, and pelvic cul-de-sac. It was necessary to  proceed with endovaginal exam following the transabdominal exam to visualize the uterus endometrium and ovaries. Color and duplex Doppler ultrasound was utilized to evaluate blood  flow to the ovaries. COMPARISON:  CT 10/10/2017 FINDINGS: Uterus Measurements: 7.6 x 4.6 x 5 cm. No fibroids or other mass visualized. Endometrium Thickness: 6.6 mm.  No focal abnormality visualized. Right ovary Measurements: 3.6 x 1.6 x 2.8 cm. Small septated cyst versus adjacent cysts measuring 1.8 cm. Left ovary Measurements: 3.9 x 2.9 x 3.6 cm within the left adnexa, adjacent and possibly involving left ovary and extending over the superior aspect of left uterus is a large complex mass measuring 9 x 5.5 x 8.8 cm, corresponding to complex fluid collection on CT. 1.4 cm echogenic calcification at the left adnexa corresponding to CT calcification. Pulsed Doppler evaluation of both ovaries demonstrates normal low-resistance arterial and venous waveforms. Other findings Trace free fluid IMPRESSION: 1. Negative for ovarian torsion. 2. 9 cm complex mass within the left adnexa and adjacent to the left ovary, extending superior to the left aspect of uterus. Findings could be secondary to tubo-ovarian abscess in the correct clinical setting. Note that the complex mass cannot be distinguished from adjacent sigmoid colon on the CT and bowel perforation with abscess/secondary involvement of the left adnexa is also included in the differential. Electronically Signed   By: Jasmine Pang M.D.   On: 10/10/2017 16:58   Ct Abdomen Pelvis W Contrast  Result Date: 10/10/2017 CLINICAL DATA:  Leukocytosis. Right lower quadrant and upper quadrant pain. EXAM: CT ABDOMEN AND PELVIS WITH CONTRAST TECHNIQUE: Multidetector CT imaging of the abdomen and pelvis was performed using the standard protocol following bolus administration of intravenous contrast. CONTRAST:  ISOVUE-300 IOPAMIDOL (ISOVUE-300) INJECTION 61% COMPARISON:  None. FINDINGS: Lower chest: Left basilar linear atelectasis or scarring is present. The heart size is normal. No significant pleural or pericardial effusion is present. Hepatobiliary: Fatty infiltration of  the liver is suggested. No discrete lesions are present. There is no biliary dilation. Common bile duct is normal. Pancreas: Unremarkable. No pancreatic ductal dilatation or surrounding inflammatory changes. Spleen: Normal in size without focal abnormality. Adrenals/Urinary Tract: Adrenal glands are normal. A 12 mm simple cyst is present in the midportion of the left kidney. A second cyst measures 6 mm. Two subcentimeter cysts are present at the lower pole of the right kidney. Ureters are within normal limits bilaterally. The urinary bladder is normal. Stomach/Bowel: The stomach and duodenum are within normal limits. There is mild dilation of distal small bowel, likely ileus adjacent to the peripherally enhancing abscess. No obstruction is present. Terminal ileum is somewhat thickened. The appendix is visualized and within normal limits. The ascending and transverse colon are within normal limits. The proximal descending colon is mostly collapsed. The sigmoid colon passes immediately adjacent to the peripherally enhancing collection. Vascular/Lymphatic: Subcentimeter left iliac nodes are likely reactive. No significant adenopathy is present. Reproductive: The uterus is somewhat edematous. A multiloculated peripherally enhancing fluid collection lies immediately superior to the uterus and about the left adnexa. The largest component measures 5.8 x 4.2 x 3.2 cm. A 12 mm calcification is also present in the left adnexa. The right adnexa is unremarkable. Other: No layering free fluid is present. There is edema in the distal small bowel mesentery adjacent to the collection. No free air is present. Musculoskeletal: Vertebral body heights are normal. No focal lytic or blastic lesions are present. The bony pelvis is intact. The hips are located and within normal limits. IMPRESSION: 1.  Complex peripherally enhancing fluid collection centered about the left adnexa and uterus may be loculated. The largest component measures  5.8 x 4.2 x 2.7 cm. This most likely represents a complicated tubo-ovarian abscess. Complicated sigmoid colitis is considered less likely without other inflammatory changes about the colon. The colon appears to be collapsed and passes adjacent to the collection. 2. 12 mm calcification in the left adnexa likely represents a dystrophic left ovarian calcification. 3. Mild dilation of distal small bowel adjacent to the collection likely represents ileus and secondary inflammation. 4. Edema is present in the distal small bowel mesentery adjacent to the collection. 5. Probable hepatic steatosis. 6. Bilateral renal cysts appear benign. These results were called by telephone at the time of interpretation on 10/10/2017 at 2:42 pm to [facility], who verbally acknowledged these results. Electronically Signed   By: Marin Roberts M.D.   On: 10/10/2017 14:48   [place] Art/ven Flow Abd Pelv Doppler  Result Date: 10/10/2017 CLINICAL DATA:  Pelvic pain EXAM: TRANSABDOMINAL AND TRANSVAGINAL ULTRASOUND OF PELVIS DOPPLER ULTRASOUND OF OVARIES TECHNIQUE: Both transabdominal and transvaginal ultrasound examinations of the pelvis were performed. Transabdominal technique was performed for global imaging of the pelvis including uterus, ovaries, adnexal regions, and pelvic cul-de-sac. It was necessary to proceed with endovaginal exam following the transabdominal exam to visualize the uterus endometrium and ovaries. Color and duplex Doppler ultrasound was utilized to evaluate blood flow to the ovaries. COMPARISON:  CT 10/10/2017 FINDINGS: Uterus Measurements: 7.6 x 4.6 x 5 cm. No fibroids or other mass visualized. Endometrium Thickness: 6.6 mm.  No focal abnormality visualized. Right ovary Measurements: 3.6 x 1.6 x 2.8 cm. Small septated cyst versus adjacent cysts measuring 1.8 cm. Left ovary Measurements: 3.9 x 2.9 x 3.6 cm within the left adnexa, adjacent and possibly involving left ovary and extending over the superior aspect of  left uterus is a large complex mass measuring 9 x 5.5 x 8.8 cm, corresponding to complex fluid collection on CT. 1.4 cm echogenic calcification at the left adnexa corresponding to CT calcification. Pulsed Doppler evaluation of both ovaries demonstrates normal low-resistance arterial and venous waveforms. Other findings Trace free fluid IMPRESSION: 1. Negative for ovarian torsion. 2. 9 cm complex mass within the left adnexa and adjacent to the left ovary, extending superior to the left aspect of uterus. Findings could be secondary to tubo-ovarian abscess in the correct clinical setting. Note that the complex mass cannot be distinguished from adjacent sigmoid colon on the CT and bowel perforation with abscess/secondary involvement of the left adnexa is also included in the differential. Electronically Signed   By: Jasmine Pang M.D.   On: 10/10/2017 16:58   Ct Image Guided Drainage By Percutaneous Catheter  Result Date: 10/11/2017 INDICATION: 42 year old female with a history of pelvic abscess EXAM: CT GUIDED DRAINAGE OF  ABSCESS MEDICATIONS: The patient is currently admitted to the hospital and receiving intravenous antibiotics. The antibiotics were administered within an appropriate time frame prior to the initiation of the procedure. ANESTHESIA/SEDATION: 2.0 mg IV Versed 100 mcg IV Fentanyl Moderate Sedation Time:  10 minutes The patient was continuously monitored during the procedure by the interventional radiology nurse under my direct supervision. COMPLICATIONS: None TECHNIQUE: Informed written consent was obtained from the patient after a thorough discussion of the procedural risks, benefits and alternatives. All questions were addressed. Maximal Sterile Barrier Technique was utilized including caps, mask, sterile gowns, sterile gloves, sterile drape, hand hygiene and skin antiseptic. A timeout was performed prior to the initiation of  the procedure. PROCEDURE: The low abdomen/pelvis was prepped with  chlorhexidine in a sterile fashion, and a sterile drape was applied covering the operative field. A sterile gown and sterile gloves were used for the procedure. Local anesthesia was provided with 1% Lidocaine. Once the patient is prepped and draped in the usual sterile fashion. 1% lidocaine was used for local anesthesia. Trocar needle was advanced with CT guidance into the fluid collection from anterior abdominal approach. Once we confirmed location of the needle, modified Seldinger technique was used to place a 10 [place] drain into the pelvic fluid collection. Aspiration of approximately 90 cc of purulent material was performed. Catheter was sutured in position attached to bulb suction drainage. Sterile dressing was placed. Patient tolerated the procedure well and remained hemodynamically stable throughout. Final CT was performed. No complications were encountered and no significant blood loss. FINDINGS: CT demonstrates relatively persistence size of pelvic fluid collection before drainage. Status post 10 French drain placement, there is reduction in size of the abscess cavity with the drain centered within the largest component adjacent to the uterus. Note that the dystrophic calcification of the left pelvis remains in place. Uncertain if this does represent ovarian calcification, or potentially a appendicular at in the setting of ruptured appendicitis. IMPRESSION: Status post CT-guided drainage of pelvic abscess. Sample was sent to the lab for analysis. Signed, Yvone Neu. Reyne Dumas, RPVI [facility] Electronically Signed   By: Gilmer Mor D.O.   On: 10/11/2017 13:57    Anti-infectives: Anti-infectives (From admission, onward)   Start     Dose/Rate Route Frequency Ordered Stop   10/11/17 0400  piperacillin-tazobactam (ZOSYN) IVPB 3.375 g     3.375 g 12.5 mL/hr over 240 Minutes Intravenous Every 8 hours 10/10/17 1956     10/10/17 2200  doxycycline  (VIBRAMYCIN) 100 mg in sodium chloride 0.9 % 250 mL IVPB     100 mg 125 mL/hr over 120 Minutes Intravenous Every 12 hours 10/10/17 1953     10/10/17 2000  piperacillin-tazobactam (ZOSYN) IVPB 3.375 g     3.375 g 100 mL/hr over 30 Minutes Intravenous  Once 10/10/17 1943 10/10/17 2235   10/10/17 1530  doxycycline (VIBRAMYCIN) 100 mg in sodium chloride 0.9 % 250 mL IVPB     100 mg 125 mL/hr over 120 Minutes Intravenous  Once 10/10/17 1505 10/10/17 1856   10/10/17 1515  cefTRIAXone (ROCEPHIN) 1 g in sodium chloride 0.9 % 100 mL IVPB     1 g 200 mL/hr over 30 Minutes Intravenous  Once 10/10/17 1503 10/10/17 1652      Assessment/Plan: Pelvic abscess likely gyn etiology. Await cultures Antibiotics ? Transition to PO antibiotics Diet as tolerated.   LOS: 2 days    Almond Lint 10/12/2017

## 2017-10-13 LAB — COMPREHENSIVE METABOLIC PANEL
ALK PHOS: 206 U/L — AB (ref 38–126)
ALT: 35 U/L (ref 0–44)
AST: 18 U/L (ref 15–41)
Albumin: 2.5 g/dL — ABNORMAL LOW (ref 3.5–5.0)
Anion gap: 9 (ref 5–15)
BILIRUBIN TOTAL: 0.8 mg/dL (ref 0.3–1.2)
CALCIUM: 8.5 mg/dL — AB (ref 8.9–10.3)
CO2: 25 mmol/L (ref 22–32)
Chloride: 104 mmol/L (ref 98–111)
Creatinine, Ser: 0.74 mg/dL (ref 0.44–1.00)
GFR calc Af Amer: >60 mL/min (ref >60)
Glucose, Bld: 109 mg/dL — ABNORMAL HIGH (ref 70–99)
Potassium: 3.6 mmol/L (ref 3.5–5.1)
Sodium: 138 mmol/L (ref 135–145)
Total Protein: 6.5 g/dL (ref 6.5–8.1)

## 2017-10-13 LAB — CBC
HEMATOCRIT: 32.3 % — AB (ref 36.0–46.0)
HEMOGLOBIN: 10.6 g/dL — AB (ref 12.0–15.0)
MCH: 29.4 pg (ref 26.0–34.0)
MCHC: 32.8 g/dL (ref 30.0–36.0)
MCV: 89.5 fL (ref 78.0–100.0)
Platelets: 388 10*3/uL (ref 150–400)
RBC: 3.61 MIL/uL — AB (ref 3.87–5.11)
RDW: 12.5 % (ref 11.5–15.5)
WBC: 9.6 10*3/uL (ref 4.0–10.5)

## 2017-10-13 LAB — GC/CHLAMYDIA PROBE AMP (~~LOC~~) NOT AT ARMC
CHLAMYDIA, DNA PROBE: NEGATIVE
NEISSERIA GONORRHEA: NEGATIVE

## 2017-10-13 MED ORDER — DOXYCYCLINE HYCLATE 100 MG PO TABS
100.0000 mg | ORAL_TABLET | Freq: Two times a day (BID) | ORAL | Status: DC
  Administered 2017-10-13: 100 mg via ORAL
  Filled 2017-10-13: qty 1

## 2017-10-13 MED ORDER — POTASSIUM CHLORIDE CRYS ER 20 MEQ PO TBCR
20.0000 meq | EXTENDED_RELEASE_TABLET | Freq: Once | ORAL | Status: AC
  Administered 2017-10-13: 20 meq via ORAL
  Filled 2017-10-13: qty 1

## 2017-10-13 MED ORDER — AMOXICILLIN-POT CLAVULANATE 875-125 MG PO TABS
1.0000 | ORAL_TABLET | Freq: Two times a day (BID) | ORAL | Status: DC
  Administered 2017-10-13 – 2017-10-14 (×2): 1 via ORAL
  Filled 2017-10-13 (×3): qty 1

## 2017-10-13 MED ORDER — SACCHAROMYCES BOULARDII 250 MG PO CAPS
250.0000 mg | ORAL_CAPSULE | Freq: Two times a day (BID) | ORAL | Status: DC
  Administered 2017-10-13 – 2017-10-14 (×2): 250 mg via ORAL
  Filled 2017-10-13 (×2): qty 1

## 2017-10-13 NOTE — Progress Notes (Signed)
[facility] PROGRESS NOTE  Angel Anderson ZOX:[id number] DOB: 14-Jun-1975 DOA: 10/10/2017  PCP: Patient, No Pcp Per  Brief History/Interval Summary: 42 y.o. female with medical history significant of kidney stones otherwise no other significant history presented with complains of abdominal pain ongoing for 1 week.  Evaluation in the emergency department raised concern for pelvic abscess.  General surgery was consulted.  Case was also discussed with OB/GYN who deferred to general surgery.  Patient was hospitalized for further management.  Interventional radiology requested to drain the abscess.    Reason for Visit: Pelvic abscess  Consultants: General surgery.  Interventional radiology  Procedures: CT-guided drainage of the pelvic abscess 9/7  Antibiotics: Zosyn and doxycycline  Subjective/Interval History: Overall patient feels about the same as yesterday.  Occasional pain.  Had to take pain medication twice yesterday.  Occasional urinary frequency.  No nausea or vomiting however.  No fever or chills.  ROS: Denies any headaches.  Objective:  Vital Signs  Vitals:   10/12/17 0434 10/12/17 1419 10/12/17 1948 10/13/17 0517  BP: 120/82 139/84 134/71 132/80  Pulse: 71 84 91 76  Resp: 16  16 16   Temp: 97.7 F (36.5 C) 97.9 F (36.6 C) 98.2 F (36.8 C) 98.3 F (36.8 C)  TempSrc: Oral Oral Oral Oral  SpO2: 98% 100% 98% 100%  Weight:      Height:        Intake/Output Summary (Last 24 hours) at 10/13/2017 0846 Last data filed at 10/13/2017 0523 Gross per 24 hour  Intake 1216.91 ml  Output 30 ml  Net 1186.91 ml   Filed Weights   10/10/17 0942 10/10/17 2000  Weight: 64 kg 64 kg    General appearance: Awake alert.  In no distress Resp: Normal effort.  Clear to auscultation bilaterally Cardio: S1-S2 is normal regular.  No S3-S4.  No rubs murmurs or bruit. GI: Abdomen remains soft.  Mildly tender around the suprapubic area.  A drainage catheter is noted.  Areas covered  with dressing.   Extremities: No edema Neurologic: Alert and oriented x3.  No focal neurological deficits.  Lab Results:  Data Reviewed: I have personally reviewed following labs and imaging studies  CBC: Recent Labs  Lab 10/10/17 0954 10/10/17 2003 10/11/17 0534 10/12/17 0513 10/13/17 0512  WBC 24.1* 26.6* 18.0* 17.8* 9.6  HGB 12.6 11.8* 10.9* 10.6* 10.6*  HCT 38.2 35.7* 33.6* 32.8* 32.3*  MCV 89.5 89.5 89.8 90.9 89.5  PLT 419* 398 352 371 388    Basic Metabolic Panel: Recent Labs  Lab 10/10/17 0954 10/10/17 2003 10/11/17 0534 10/12/17 0513 10/13/17 0512  NA 137  --  139 137 138  K 4.2  --  3.6 3.3* 3.6  CL 96*  --  105 103 104  CO2 26  --  24 25 25   GLUCOSE 132*  --  103* 122* 109*  BUN <5*  --  5* 5* <5*  CREATININE 0.83 0.71 0.72 0.71 0.74  CALCIUM 9.7  --  8.5* 8.7* 8.5*    GFR: Estimated Creatinine Clearance: 79.9 mL/min (by C-G formula based on SCr of 0.74 mg/dL).  Liver Function Tests: Recent Labs  Lab 10/10/17 0954 10/11/17 0534 10/12/17 0513 10/13/17 0512  AST 41 29 21 18   ALT 80* 53* 42 35  ALKPHOS 334* 265* 258* 206*  BILITOT 1.4* 1.0 1.1 0.8  PROT 8.1 6.6 6.8 6.5  ALBUMIN 3.2* 2.4* 2.4* 2.5*    Recent Labs  Lab 10/10/17 0954  LIPASE 94*  Coagulation Profile: Recent Labs  Lab 10/11/17 0929  INR 1.05     Recent Results (from the past 240 hour(s))  Blood culture (routine x 2)     Status: None (Preliminary result)   Collection Time: 10/10/17  1:09 PM  Result Value Ref Range Status   Specimen Description BLOOD LEFT ANTECUBITAL  Final   Special Requests   Final    BOTTLES DRAWN AEROBIC ONLY Blood Culture results may not be optimal due to an inadequate volume of blood received in culture bottles   Culture   Final    NO GROWTH 3 DAYS Performed at [facility], [address], [place], [place] [address]    Report Status PENDING  Incomplete  Blood culture (routine x 2)     Status: None (Preliminary result)   Collection  Time: 10/10/17  1:15 PM  Result Value Ref Range Status   Specimen Description BLOOD RIGHT ANTECUBITAL  Final   Special Requests   Final    BOTTLES DRAWN AEROBIC AND ANAEROBIC Blood Culture results may not be optimal due to an excessive volume of blood received in culture bottles   Culture   Final    NO GROWTH 3 DAYS Performed at [facility], [address] [place], [place] [address]    Report Status PENDING  Incomplete  Wet prep, genital     Status: Abnormal   Collection Time: 10/10/17  5:57 PM  Result Value Ref Range Status   Yeast Wet Prep HPF POC NONE SEEN NONE SEEN Final   Trich, Wet Prep NONE SEEN NONE SEEN Final   Clue Cells Wet Prep HPF POC NONE SEEN NONE SEEN Final   WBC, Wet Prep HPF POC MANY (A) NONE SEEN Final   Sperm NONE SEEN  Final    Comment: Performed at [facility], [address], [place], [place] [address]  Aerobic/Anaerobic Culture (surgical/deep wound)     Status: None (Preliminary result)   Collection Time: 10/11/17 12:53 PM  Result Value Ref Range Status   Specimen Description ABSCESS ABDOMEN  Final   Special Requests POSSIBLE TUBOVARIAN ABSCESS OR RUPTURED APPY  Final   Gram Stain   Final    ABUNDANT WBC PRESENT, PREDOMINANTLY PMN ABUNDANT GRAM POSITIVE COCCI IN PAIRS IN CHAINS Performed at [facility], [address], [place], [place] [address]    Culture FEW GROUP A STREP (S.PYOGENES) ISOLATED  Final   Report Status PENDING  Incomplete      Radiology Studies: Ct Image Guided Drainage By Percutaneous Catheter  Result Date: 10/11/2017 INDICATION: 42 year old female with a history of pelvic abscess EXAM: CT GUIDED DRAINAGE OF  ABSCESS MEDICATIONS: The patient is currently admitted to the hospital and receiving intravenous antibiotics. The antibiotics were administered within an appropriate time frame prior to the initiation of the procedure. ANESTHESIA/SEDATION: 2.0 mg IV Versed 100 mcg IV Fentanyl Moderate Sedation Time:  10 minutes  The patient was continuously monitored during the procedure by the interventional radiology nurse under my direct supervision. COMPLICATIONS: None TECHNIQUE: Informed written consent was obtained from the patient after a thorough discussion of the procedural risks, benefits and alternatives. All questions were addressed. Maximal Sterile Barrier Technique was utilized including caps, mask, sterile gowns, sterile gloves, sterile drape, hand hygiene and skin antiseptic. A timeout was performed prior to the initiation of the procedure. PROCEDURE: The low abdomen/pelvis was prepped with chlorhexidine in a sterile fashion, and a sterile drape was applied covering the operative  field. A sterile gown and sterile gloves were used for the procedure. Local anesthesia was provided with 1% Lidocaine. Once the patient is prepped and draped in the usual sterile fashion. 1% lidocaine was used for local anesthesia. Trocar needle was advanced with CT guidance into the fluid collection from anterior abdominal approach. Once we confirmed location of the needle, modified Seldinger technique was used to place a 10 [place] drain into the pelvic fluid collection. Aspiration of approximately 90 cc of purulent material was performed. Catheter was sutured in position attached to bulb suction drainage. Sterile dressing was placed. Patient tolerated the procedure well and remained hemodynamically stable throughout. Final CT was performed. No complications were encountered and no significant blood loss. FINDINGS: CT demonstrates relatively persistence size of pelvic fluid collection before drainage. Status post 10 French drain placement, there is reduction in size of the abscess cavity with the drain centered within the largest component adjacent to the uterus. Note that the dystrophic calcification of the left pelvis remains in place. Uncertain if this does represent ovarian calcification, or potentially a appendicular at in the setting of  ruptured appendicitis. IMPRESSION: Status post CT-guided drainage of pelvic abscess. Sample was sent to the lab for analysis. Signed, Yvone Neu. Reyne Dumas, RPVI [facility] Radiology Electronically Signed   By: Gilmer Mor D.O.   On: 10/11/2017 13:57     Medications:  Scheduled: . enoxaparin (LOVENOX) injection  40 mg Subcutaneous Daily  . multivitamin with minerals  1 tablet Oral Daily  . sodium chloride flush  5 mL Intracatheter Q8H  . vitamin E  400 Units Oral Daily   Continuous: . sodium chloride 30 mL/hr at 10/13/17 0519  . doxycycline (VIBRAMYCIN) IV 100 mg (10/12/17 2110)  . piperacillin-tazobactam (ZOSYN)  IV 3.375 g (10/13/17 0520)   ZOX:[id number], traMADol  Assessment/Plan:    Pelvic abscess with sepsis/group A strep Etiology unclear but thought to be GYN source.  General surgery is following.  Patient was placed on IV antibiotics with doxycycline and Zosyn.  She was also given a dose of ceftriaxone.  Cultures positive for group A strep.  No risk factors for her to have a STI.  HIV nonreactive. GC chlamydia probe is pending.  Patient does not have a gynecologist in town.  She is originally from [place] and relocated here about 3 years ago which was the last time she was last evaluated by gynecology.  Interventional radiology was consulted.  She underwent CT-guided drainage of the abscess with catheter placement.  Blood cultures are negative so far.  Okay to stop IV fluids.  Patient was thought to have sepsis at admission due to elevated WBC, fever, tachycardia.  Sepsis physiology has resolved.  Further management per general surgery.  Normocytic anemia Likely due to acute illness.  No evidence of overt bleeding.  Anemia panel reviewed.  Ferritin 336.  B12 804.  Folate 16.8.  Iron 27 with a TIBC of 2.5.  Percent saturation is 12.  Mildly abnormal LFTs with mildly elevated ALT and alkaline phosphatase No obvious abnormalities  noted on CT scan.  AST and ALT now normal.  Alkaline phosphatase mildly elevated.  Will need follow-up as outpatient. Mildly elevated lipase levels noted.   DVT Prophylaxis: Lovenox    Code Status: Full code Family Communication: Discussed with the patient Disposition Plan: Patient continues to mobilize.  Await further input from general surgery.  Hopefully transition to oral antibiotics soon.    LOS: 3 days   Lura Falor  Rito Ehrlich  [facility] Pager [phone] 10/13/2017, 8:46 AM  If 7PM-7AM, please contact night-coverage at [email or web address], password [facility]

## 2017-10-13 NOTE — Plan of Care (Signed)

## 2017-10-13 NOTE — Discharge Instructions (Signed)
Percutaneous Abscess Drain °Percutaneous abscess drain is removal of a collection of infected fluid inside the body (abscess). This is done by placing a thin needle under the skin and moving it into the abscess. A small tube (catheter) is inserted during the procedure and left in place for a few days to continue to drain the abscess. °Tell a health care provider about: °· Any allergies you have. °· All medicines you are taking, including vitamins, herbs, eye drops, creams, and over-the-counter medicines. °· Any problems you or family members have had with anesthetic medicines. °· Any blood disorders you have. °· Any surgeries you have had. °· Any medical conditions you have. °· Whether you are pregnant or may be pregnant. °· Any history of tobacco use or smoking. °What are the risks? °Generally, this is a safe procedure. However, problems may occur, including: °· Infection. °· Bleeding. °· Allergic reaction to medicines or materials used. °· Damage to other structures or organs. °· Blockage of the catheter, requiring placement of a new catheter. °· A need to repeat the procedure. °· Failure of the procedure to drain the abscess completely, requiring an open surgical procedure to drain the abscess. An open procedure is done through a larger incision. ° °What happens before the procedure? °Medicines °· Ask your health care provider about: °? Changing or stopping your regular medicines. This is especially important if you are taking diabetes medicines or blood thinners. °? Taking medicines such as aspirin and ibuprofen. These medicines can thin your blood. Do not take these medicines before your procedure if your health care provider instructs you not to. °Staying hydrated °Follow instructions from your health care provider about hydration, which may include: °· Up to 2 hours before the procedure - you may continue to drink clear liquids, such as water, clear fruit juice, black coffee, and plain tea. ° °Eating and  drinking restrictions °Follow instructions from your health care provider about eating and drinking, which may include: °· 8 hours before the procedure - stop eating heavy meals or foods such as meat, fried foods, or fatty foods. °· 6 hours before the procedure - stop eating light meals or foods, such as toast or cereal. °· 6 hours before the procedure - stop drinking milk or drinks that contain milk. °· 2 hours before the procedure - stop drinking clear liquids. ° °General instructions ° °· Plan to have someone take you home from the hospital or clinic. °· If you will be going home right after the procedure, plan to have someone with you for 24 hours. °· You may have blood tests or urine tests. °· You may get a tetanus shot. °· You may have imaging tests, such as an ultrasound, to check how large or deep your abscess is. °What happens during the procedure? °· To lower your risk of infection: °? Your health care team will wash or sanitize their hands. °? The skin around the abscess will be washed with soap. °? Hair may be removed from the surgical site. °· An IV tube will be inserted into one of your veins. °· You will be given medicine to numb the area (local anesthetic) where the catheter will be placed. Placement of the catheter varies depending on where your abscess is located. °· You may be given medicine to help you relax (sedative) or medicine to make you fall asleep (general anesthetic). °· A small incision will be made in your skin. °· A needle will be inserted under your skin and moved   into the abscess. Images from ultrasound, X-ray, or a CT scan will be used to help guide the needle to the abscess.  A catheter will be inserted into your incision and moved underneath your skin until it reaches the abscess. Images will be used to help guide the catheter to the abscess.  After the catheter is in place, the needle will be removed. The catheter will be connected to a bag outside of your body. The catheter  will stay in place until the fluid has stopped draining and the infection is gone. The procedure may vary among health care providers and hospitals. What happens after the procedure?  Your blood pressure, heart rate, breathing rate, and blood oxygen level will be monitored until the medicines you were given have worn off.  You may have some pain or nausea. Medicines will be available to help you. Summary  An abscess is a collection of infected fluid inside the body.  During this procedure, images from ultrasound, X-rays, or a CT scan are used to help guide the needle and catheter to the abscess.  A catheter will be left in place to continue to drain the abscess after the procedure. This information is not intended to replace advice given to you by your health care provider. Make sure you discuss any questions you have with your health care provider. Document Released: 06/07/2013 Document Revised: 12/14/2015 Document Reviewed: 12/14/2015 [employer] Interactive Patient Education  2017 [employer]   Flush drain at home twice a day.

## 2017-10-13 NOTE — Progress Notes (Addendum)
Referring Physician(s): Garba,M/Blackman,D  Supervising Physician: Oley Balm  Patient Status:  [facility] - In-pt  Chief Complaint:  Pelvic abscess/abdominal pain  Subjective: Pt feeling better since pelvic drain placed 9/7; denies N/V; still has some drain site tenderness   Allergies: Patient has no known allergies.  Medications: Prior to Admission medications   Medication Sig Start Date End Date Taking? Authorizing Provider  Multiple Vitamins-Minerals (MULTIVITAMIN WITH MINERALS) tablet Take 1 tablet by mouth daily.   Yes [provider]  nitrofurantoin, macrocrystal-monohydrate, (MACROBID) 100 MG capsule Take 100 mg by mouth 2 (two) times daily. x10 days   Yes [provider]  vitamin E 400 UNIT capsule Take 400 Units by mouth daily.   Yes [provider]     Vital Signs: BP 132/80 (BP Location: Left Arm)   Pulse 76   Temp 98.3 F (36.8 C) (Oral)   Resp 16   Ht 5\' 4"  (1.626 m)   Wt 141 lb (64 kg)   LMP 09/17/2017 (Within Days)   SpO2 100%   BMI 24.20 kg/m   Physical Exam mid pelvic drain intact, dressing dry, site mildly tender, output 30 cc serous blood-tinged fluid  Imaging: US Transvaginal Non-ob  Result Date: 10/10/2017 CLINICAL DATA:  Pelvic pain EXAM: TRANSABDOMINAL AND TRANSVAGINAL ULTRASOUND OF PELVIS DOPPLER ULTRASOUND OF OVARIES TECHNIQUE: Both transabdominal and transvaginal ultrasound examinations of the pelvis were performed. Transabdominal technique was performed for global imaging of the pelvis including uterus, ovaries, adnexal regions, and pelvic cul-de-sac. It was necessary to proceed with endovaginal exam following the transabdominal exam to visualize the uterus endometrium and ovaries. Color and duplex Doppler ultrasound was utilized to evaluate blood flow to the ovaries. COMPARISON:  CT 10/10/2017 FINDINGS: Uterus Measurements: 7.6 x 4.6 x 5 cm. No fibroids or other mass visualized. Endometrium Thickness: 6.6 mm.  No  focal abnormality visualized. Right ovary Measurements: 3.6 x 1.6 x 2.8 cm. Small septated cyst versus adjacent cysts measuring 1.8 cm. Left ovary Measurements: 3.9 x 2.9 x 3.6 cm within the left adnexa, adjacent and possibly involving left ovary and extending over the superior aspect of left uterus is a large complex mass measuring 9 x 5.5 x 8.8 cm, corresponding to complex fluid collection on CT. 1.4 cm echogenic calcification at the left adnexa corresponding to CT calcification. Pulsed Doppler evaluation of both ovaries demonstrates normal low-resistance arterial and venous waveforms. Other findings Trace free fluid IMPRESSION: 1. Negative for ovarian torsion. 2. 9 cm complex mass within the left adnexa and adjacent to the left ovary, extending superior to the left aspect of uterus. Findings could be secondary to tubo-ovarian abscess in the correct clinical setting. Note that the complex mass cannot be distinguished from adjacent sigmoid colon on the CT and bowel perforation with abscess/secondary involvement of the left adnexa is also included in the differential. Electronically Signed   By: Jasmine Pang M.D.   On: 10/10/2017 16:58   US Pelvis Complete  Result Date: 10/10/2017 CLINICAL DATA:  Pelvic pain EXAM: TRANSABDOMINAL AND TRANSVAGINAL ULTRASOUND OF PELVIS DOPPLER ULTRASOUND OF OVARIES TECHNIQUE: Both transabdominal and transvaginal ultrasound examinations of the pelvis were performed. Transabdominal technique was performed for global imaging of the pelvis including uterus, ovaries, adnexal regions, and pelvic cul-de-sac. It was necessary to proceed with endovaginal exam following the transabdominal exam to visualize the uterus endometrium and ovaries. Color and duplex Doppler ultrasound was utilized to evaluate blood flow to the ovaries. COMPARISON:  CT 10/10/2017 FINDINGS: Uterus Measurements: 7.6 x 4.6  x 5 cm. No fibroids or other mass visualized. Endometrium Thickness: 6.6 mm.  No focal abnormality  visualized. Right ovary Measurements: 3.6 x 1.6 x 2.8 cm. Small septated cyst versus adjacent cysts measuring 1.8 cm. Left ovary Measurements: 3.9 x 2.9 x 3.6 cm within the left adnexa, adjacent and possibly involving left ovary and extending over the superior aspect of left uterus is a large complex mass measuring 9 x 5.5 x 8.8 cm, corresponding to complex fluid collection on CT. 1.4 cm echogenic calcification at the left adnexa corresponding to CT calcification. Pulsed Doppler evaluation of both ovaries demonstrates normal low-resistance arterial and venous waveforms. Other findings Trace free fluid IMPRESSION: 1. Negative for ovarian torsion. 2. 9 cm complex mass within the left adnexa and adjacent to the left ovary, extending superior to the left aspect of uterus. Findings could be secondary to tubo-ovarian abscess in the correct clinical setting. Note that the complex mass cannot be distinguished from adjacent sigmoid colon on the CT and bowel perforation with abscess/secondary involvement of the left adnexa is also included in the differential. Electronically Signed   By: Jasmine Pang M.D.   On: 10/10/2017 16:58   Ct Abdomen Pelvis W Contrast  Result Date: 10/10/2017 CLINICAL DATA:  Leukocytosis. Right lower quadrant and upper quadrant pain. EXAM: CT ABDOMEN AND PELVIS WITH CONTRAST TECHNIQUE: Multidetector CT imaging of the abdomen and pelvis was performed using the standard protocol following bolus administration of intravenous contrast. CONTRAST:  ISOVUE-300 IOPAMIDOL (ISOVUE-300) INJECTION 61% COMPARISON:  None. FINDINGS: Lower chest: Left basilar linear atelectasis or scarring is present. The heart size is normal. No significant pleural or pericardial effusion is present. Hepatobiliary: Fatty infiltration of the liver is suggested. No discrete lesions are present. There is no biliary dilation. Common bile duct is normal. Pancreas: Unremarkable. No pancreatic ductal dilatation or surrounding  inflammatory changes. Spleen: Normal in size without focal abnormality. Adrenals/Urinary Tract: Adrenal glands are normal. A 12 mm simple cyst is present in the midportion of the left kidney. A second cyst measures 6 mm. Two subcentimeter cysts are present at the lower pole of the right kidney. Ureters are within normal limits bilaterally. The urinary bladder is normal. Stomach/Bowel: The stomach and duodenum are within normal limits. There is mild dilation of distal small bowel, likely ileus adjacent to the peripherally enhancing abscess. No obstruction is present. Terminal ileum is somewhat thickened. The appendix is visualized and within normal limits. The ascending and transverse colon are within normal limits. The proximal descending colon is mostly collapsed. The sigmoid colon passes immediately adjacent to the peripherally enhancing collection. Vascular/Lymphatic: Subcentimeter left iliac nodes are likely reactive. No significant adenopathy is present. Reproductive: The uterus is somewhat edematous. A multiloculated peripherally enhancing fluid collection lies immediately superior to the uterus and about the left adnexa. The largest component measures 5.8 x 4.2 x 3.2 cm. A 12 mm calcification is also present in the left adnexa. The right adnexa is unremarkable. Other: No layering free fluid is present. There is edema in the distal small bowel mesentery adjacent to the collection. No free air is present. Musculoskeletal: Vertebral body heights are normal. No focal lytic or blastic lesions are present. The bony pelvis is intact. The hips are located and within normal limits. IMPRESSION: 1. Complex peripherally enhancing fluid collection centered about the left adnexa and uterus may be loculated. The largest component measures 5.8 x 4.2 x 2.7 cm. This most likely represents a complicated tubo-ovarian abscess. Complicated sigmoid colitis is considered less  likely without other inflammatory changes about the  colon. The colon appears to be collapsed and passes adjacent to the collection. 2. 12 mm calcification in the left adnexa likely represents a dystrophic left ovarian calcification. 3. Mild dilation of distal small bowel adjacent to the collection likely represents ileus and secondary inflammation. 4. Edema is present in the distal small bowel mesentery adjacent to the collection. 5. Probable hepatic steatosis. 6. Bilateral renal cysts appear benign. These results were called by telephone at the time of interpretation on 10/10/2017 at 2:42 pm to [facility], who verbally acknowledged these results. Electronically Signed   By: Marin Roberts M.D.   On: 10/10/2017 14:48   [place] Art/ven Flow Abd Pelv Doppler  Result Date: 10/10/2017 CLINICAL DATA:  Pelvic pain EXAM: TRANSABDOMINAL AND TRANSVAGINAL ULTRASOUND OF PELVIS DOPPLER ULTRASOUND OF OVARIES TECHNIQUE: Both transabdominal and transvaginal ultrasound examinations of the pelvis were performed. Transabdominal technique was performed for global imaging of the pelvis including uterus, ovaries, adnexal regions, and pelvic cul-de-sac. It was necessary to proceed with endovaginal exam following the transabdominal exam to visualize the uterus endometrium and ovaries. Color and duplex Doppler ultrasound was utilized to evaluate blood flow to the ovaries. COMPARISON:  CT 10/10/2017 FINDINGS: Uterus Measurements: 7.6 x 4.6 x 5 cm. No fibroids or other mass visualized. Endometrium Thickness: 6.6 mm.  No focal abnormality visualized. Right ovary Measurements: 3.6 x 1.6 x 2.8 cm. Small septated cyst versus adjacent cysts measuring 1.8 cm. Left ovary Measurements: 3.9 x 2.9 x 3.6 cm within the left adnexa, adjacent and possibly involving left ovary and extending over the superior aspect of left uterus is a large complex mass measuring 9 x 5.5 x 8.8 cm, corresponding to complex fluid collection on CT. 1.4 cm echogenic calcification at the left adnexa corresponding to CT  calcification. Pulsed Doppler evaluation of both ovaries demonstrates normal low-resistance arterial and venous waveforms. Other findings Trace free fluid IMPRESSION: 1. Negative for ovarian torsion. 2. 9 cm complex mass within the left adnexa and adjacent to the left ovary, extending superior to the left aspect of uterus. Findings could be secondary to tubo-ovarian abscess in the correct clinical setting. Note that the complex mass cannot be distinguished from adjacent sigmoid colon on the CT and bowel perforation with abscess/secondary involvement of the left adnexa is also included in the differential. Electronically Signed   By: Jasmine Pang M.D.   On: 10/10/2017 16:58   Ct Image Guided Drainage By Percutaneous Catheter  Result Date: 10/11/2017 INDICATION: 41 year old female with a history of pelvic abscess EXAM: CT GUIDED DRAINAGE OF  ABSCESS MEDICATIONS: The patient is currently admitted to the hospital and receiving intravenous antibiotics. The antibiotics were administered within an appropriate time frame prior to the initiation of the procedure. ANESTHESIA/SEDATION: 2.0 mg IV Versed 100 mcg IV Fentanyl Moderate Sedation Time:  10 minutes The patient was continuously monitored during the procedure by the interventional radiology nurse under my direct supervision. COMPLICATIONS: None TECHNIQUE: Informed written consent was obtained from the patient after a thorough discussion of the procedural risks, benefits and alternatives. All questions were addressed. Maximal Sterile Barrier Technique was utilized including caps, mask, sterile gowns, sterile gloves, sterile drape, hand hygiene and skin antiseptic. A timeout was performed prior to the initiation of the procedure. PROCEDURE: The low abdomen/pelvis was prepped with chlorhexidine in a sterile fashion, and a sterile drape was applied covering the operative field. A sterile gown and sterile gloves were used for the procedure. Local anesthesia was provided  with 1% Lidocaine. Once the patient is prepped and draped in the usual sterile fashion. 1% lidocaine was used for local anesthesia. Trocar needle was advanced with CT guidance into the fluid collection from anterior abdominal approach. Once we confirmed location of the needle, modified Seldinger technique was used to place a 10 [place] drain into the pelvic fluid collection. Aspiration of approximately 90 cc of purulent material was performed. Catheter was sutured in position attached to bulb suction drainage. Sterile dressing was placed. Patient tolerated the procedure well and remained hemodynamically stable throughout. Final CT was performed. No complications were encountered and no significant blood loss. FINDINGS: CT demonstrates relatively persistence size of pelvic fluid collection before drainage. Status post 10 French drain placement, there is reduction in size of the abscess cavity with the drain centered within the largest component adjacent to the uterus. Note that the dystrophic calcification of the left pelvis remains in place. Uncertain if this does represent ovarian calcification, or potentially a appendicular at in the setting of ruptured appendicitis. IMPRESSION: Status post CT-guided drainage of pelvic abscess. Sample was sent to the lab for analysis. Signed, Yvone Neu. Reyne Dumas, RPVI [facility] Radiology Electronically Signed   By: Gilmer Mor D.O.   On: 10/11/2017 13:57    Labs:  CBC: Recent Labs    10/10/17 2003 10/11/17 0534 10/12/17 0513 10/13/17 0512  WBC 26.6* 18.0* 17.8* 9.6  HGB 11.8* 10.9* 10.6* 10.6*  HCT 35.7* 33.6* 32.8* 32.3*  PLT 398 352 371 388    COAGS: Recent Labs    10/11/17 0929  INR 1.05    BMP: Recent Labs    10/10/17 0954 10/10/17 2003 10/11/17 0534 10/12/17 0513 10/13/17 0512  NA 137  --  139 137 138  K 4.2  --  3.6 3.3* 3.6  CL 96*  --  105 103 104  CO2 26  --  24 25 25   GLUCOSE 132*   --  103* 122* 109*  BUN <5*  --  5* 5* <5*  CALCIUM 9.7  --  8.5* 8.7* 8.5*  CREATININE 0.83 0.71 0.72 0.71 0.74  GFRNONAA >60 >60 >60 >60 >60  GFRAA >60 >60 >60 >60 >60    LIVER FUNCTION TESTS: Recent Labs    10/10/17 0954 10/11/17 0534 10/12/17 0513 10/13/17 0512  BILITOT 1.4* 1.0 1.1 0.8  AST 41 29 21 18   ALT 80* 53* 42 35  ALKPHOS 334* 265* 258* 206*  PROT 8.1 6.6 6.8 6.5  ALBUMIN 3.2* 2.4* 2.4* 2.5*    Assessment and Plan: Pt s/p drainage of pelvic fluid collection ?TOA 9/7; afebrile; WBC nl; creat nl; hgb stable; fluid cx- few group A strept; cont with drain irrigation , output monitoring; check f/u CT within 1-2 weeks of placement; will tent set up f/u at IR drain clinic; antbx per primary team; call [phone] or [phone] with any drain related questions   Electronically Signed: D. Jeananne Rama, PA-C 10/13/2017, 1:41 PM   I spent a total of 15 minutes at the the patient's bedside AND on the patient's hospital floor or unit, greater than 50% of which was counseling/coordinating care for pelvic abscess drain    Patient ID: Angel Anderson, female   DOB: 1975-05-09, 42 y.o.   MRN: [id number]

## 2017-10-13 NOTE — Progress Notes (Signed)
CC:  Abdominal pain  Subjective: She looks good, pain is much improved and she is tolerating her diet.  Objective: Vital signs in last 24 hours: Temp:  [97.9 F (36.6 C)-98.3 F (36.8 C)] 98.3 F (36.8 C) (09/09 0517) Pulse Rate:  [76-91] 76 (09/09 0517) Resp:  [16] 16 (09/09 0517) BP: (132-139)/(71-84) 132/80 (09/09 0517) SpO2:  [98 %-100 %] 100 % (09/09 0517) Last BM Date: 10/12/17 300 p.o. 900 IV Urine x1 recorded Drain: 30 recorded No BM recorded.3 Afebrile the last 24 hours, vital signs are stable, Labs: WBC 26.6 (9/6) >> 9.6 (9/9) LFTs improving Hemoglobin hematocrit stable CT 10/10/2017: Complex peripherally enhancing fluid collection around the left adnexa and uterus largest collection is 5.8 x 4.2 x 2.7 this was thought to represent most likely a tubo-ovarian abscess sigmoid colitis thought to be less likely.  There is no mention of small bowel diverticuli. Culture: FEW GROUP A STREP (S.PYOGENES) ISOLATED  Intake/Output from previous day: 09/08 0701 - 09/09 0700 In: 1216.9 [P.O.:300; I.V.:620.7; IV Piggyback:286.2] Out: 30 [Drains:30] Intake/Output this shift: No intake/output data recorded.  General appearance: alert, cooperative and no distress Resp: clear to auscultation bilaterally GI: soft, non-tender; bowel sounds normal; no masses,  no organomegaly  Lab Results:  Recent Labs    10/12/17 0513 10/13/17 0512  WBC 17.8* 9.6  HGB 10.6* 10.6*  HCT 32.8* 32.3*  PLT 371 388    BMET Recent Labs    10/12/17 0513 10/13/17 0512  NA 137 138  K 3.3* 3.6  CL 103 104  CO2 25 25  GLUCOSE 122* 109*  BUN 5* <5*  CREATININE 0.71 0.74  CALCIUM 8.7* 8.5*   PT/INR Recent Labs    10/11/17 0929  LABPROT 13.6  INR 1.05    Recent Labs  Lab 10/10/17 0954 10/11/17 0534 10/12/17 0513 10/13/17 0512  AST 41 29 21 18   ALT 80* 53* 42 35  ALKPHOS 334* 265* 258* 206*  BILITOT 1.4* 1.0 1.1 0.8  PROT 8.1 6.6 6.8 6.5  ALBUMIN 3.2* 2.4* 2.4* 2.5*      Lipase     Component Value Date/Time   LIPASE 94 (H) 10/10/2017 0954     Medications: . enoxaparin (LOVENOX) injection  40 mg Subcutaneous Daily  . multivitamin with minerals  1 tablet Oral Daily  . sodium chloride flush  5 mL Intracatheter Q8H  . vitamin E  400 Units Oral Daily   . sodium chloride 30 mL/hr at 10/13/17 0519  . doxycycline (VIBRAMYCIN) IV 100 mg (10/12/17 2110)  . piperacillin-tazobactam (ZOSYN)  IV 3.375 g (10/13/17 0520)   Anti-infectives (From admission, onward)   Start     Dose/Rate Route Frequency Ordered Stop   10/11/17 0400  piperacillin-tazobactam (ZOSYN) IVPB 3.375 g     3.375 g 12.5 mL/hr over 240 Minutes Intravenous Every 8 hours 10/10/17 1956     10/10/17 2200  doxycycline (VIBRAMYCIN) 100 mg in sodium chloride 0.9 % 250 mL IVPB     100 mg 125 mL/hr over 120 Minutes Intravenous Every 12 hours 10/10/17 1953     10/10/17 2000  piperacillin-tazobactam (ZOSYN) IVPB 3.375 g     3.375 g 100 mL/hr over 30 Minutes Intravenous  Once 10/10/17 1943 10/10/17 2235   10/10/17 1530  doxycycline (VIBRAMYCIN) 100 mg in sodium chloride 0.9 % 250 mL IVPB     100 mg 125 mL/hr over 120 Minutes Intravenous  Once 10/10/17 1505 10/10/17 1856   10/10/17 1515  cefTRIAXone (ROCEPHIN) 1  g in sodium chloride 0.9 % 100 mL IVPB     1 g 200 mL/hr over 30 Minutes Intravenous  Once 10/10/17 1503 10/10/17 1652      Assessment/Plan Normocytic anemia Mild LFT elevation History of nephrolithiasis  Pelvic abscess -probable GYN etiology IR drain placement 10/11/2017 -90 cc purulent fluid aspirated  FEN: IV fluids/regular diet ID: Doxycycline 9/6 =>>day 4; Zosyn 9/6 =>> day for DVT: Lovenox Follow-up: TBD  Plan:  Growing Strep; not likely to be related to the GI tract. Will discuss with Dr. Magnus Ivan.  I think we could switch to oral abx, and I will check on follow up.  Most likely go home and follow up with drain clinic.  She would need a colonoscopy in about 6-8 weeks to be  complete.    LOS: 3 days    Broc Caspers 10/13/2017 [phone]

## 2017-10-14 ENCOUNTER — Other Ambulatory Visit: Payer: Self-pay | Admitting: Surgery

## 2017-10-14 DIAGNOSIS — N739 Female pelvic inflammatory disease, unspecified: Secondary | ICD-10-CM

## 2017-10-14 LAB — CBC
HCT: 33.6 % — ABNORMAL LOW (ref 36.0–46.0)
Hemoglobin: 10.8 g/dL — ABNORMAL LOW (ref 12.0–15.0)
MCH: 29 pg (ref 26.0–34.0)
MCHC: 32.1 g/dL (ref 30.0–36.0)
MCV: 90.3 fL (ref 78.0–100.0)
PLATELETS: 407 10*3/uL — AB (ref 150–400)
RBC: 3.72 MIL/uL — AB (ref 3.87–5.11)
RDW: 12.4 % (ref 11.5–15.5)
WBC: 7 10*3/uL (ref 4.0–10.5)

## 2017-10-14 LAB — BASIC METABOLIC PANEL
Anion gap: 10 (ref 5–15)
BUN: 6 mg/dL (ref 6–20)
CO2: 26 mmol/L (ref 22–32)
Calcium: 9 mg/dL (ref 8.9–10.3)
Chloride: 103 mmol/L (ref 98–111)
Creatinine, Ser: 0.68 mg/dL (ref 0.44–1.00)
GFR calc Af Amer: >60 mL/min (ref >60)
GLUCOSE: 103 mg/dL — AB (ref 70–99)
POTASSIUM: 3.6 mmol/L (ref 3.5–5.1)
SODIUM: 139 mmol/L (ref 135–145)

## 2017-10-14 MED ORDER — AMOXICILLIN-POT CLAVULANATE 875-125 MG PO TABS: 1.0000 | ORAL_TABLET | Freq: Two times a day (BID) | ORAL | 0 refills | Status: AC

## 2017-10-14 MED ORDER — SACCHAROMYCES BOULARDII 250 MG PO CAPS: 250.0000 mg | ORAL_CAPSULE | Freq: Two times a day (BID) | ORAL | 0 refills | Status: DC

## 2017-10-14 MED ORDER — SACCHAROMYCES BOULARDII 250 MG PO CAPS: 250.0000 mg | ORAL_CAPSULE | Freq: Two times a day (BID) | ORAL | 0 refills | Status: AC

## 2017-10-14 MED ORDER — AMOXICILLIN-POT CLAVULANATE 875-125 MG PO TABS: 1.0000 | ORAL_TABLET | Freq: Two times a day (BID) | ORAL | 0 refills | Status: DC

## 2017-10-14 MED ORDER — TRAMADOL HCL 50 MG PO TABS: 50.0000 mg | ORAL_TABLET | Freq: Two times a day (BID) | ORAL | 0 refills | Status: DC | PRN

## 2017-10-14 MED ORDER — TRAMADOL HCL 50 MG PO TABS: 50.0000 mg | ORAL_TABLET | Freq: Two times a day (BID) | ORAL | 0 refills | Status: AC | PRN

## 2017-10-14 NOTE — Discharge Summary (Signed)
[facility]  Physician Discharge Summary   Patient ID: Angel Anderson MRN: [id number] DOB/AGE: 06-Feb-1975 42 y.o.  Admit date: 10/10/2017 Discharge date: 10/14/2017  PCP: Patient, No Pcp Per  DISCHARGE DIAGNOSES:  Pelvic abscess, group A streptococcus   RECOMMENDATIONS FOR OUTPATIENT FOLLOW UP: 1. Patient has been advised to follow-up with a gynecologist 2. General surgery has made an follow-up appointment 3. Patient to follow-up at the drain clinic as well   DISCHARGE CONDITION: fair  Diet recommendation: Regular as tolerated  Filed Weights   10/10/17 0942 10/10/17 2000  Weight: 64 kg 64 kg    INITIAL HISTORY: 42 y.o.femalewith medical history significant ofkidney stones otherwise no other significant history presented with complains of abdominal pain ongoing for 1 week.  Evaluation in the emergency department raised concern for pelvic abscess.  General surgery was consulted.  Case was also discussed with OB/GYN who deferred to general surgery.  Patient was hospitalized for further management.  Interventional radiology requested to drain the abscess.    Consultants: General surgery.  Interventional radiology  Procedures: CT-guided drainage of the pelvic abscess 9/7   HOSPITAL COURSE:   Pelvic abscess with sepsis/group A strep Etiology unclear but thought to be GYN source.    Patient was seen by general surgery.  GYN was also involved briefly.  They deferred management to general surgery.  Subsequently interventional radiology was consulted.  Patient underwent drainage of the abscess and catheter placement.  Patient feels well.  She was initially placed on intravenous antibiotics with doxycycline and Zosyn.  Cultures from the abscess grew Streptococcus pyogenes.  WBC has improved.  Fevers have subsided.  Sepsis physiology has resolved.  She was transitioned to Augmentin.  Follow-up with general surgery and IR drain clinic.  She has also been advised to follow-up  with GYN as this could be a GYN source.  She might benefit from colonoscopy eventually.  She was also given a dose of ceftriaxone.  No risk factors for her to have a STI.  HIV nonreactive. GC chlamydia probe is negative. Patient does not have a gynecologist in town.  She is originally from [place] and relocated here about 3 years ago which was the last time she was last evaluated by gynecology.  Blood cultures are negative. Patient was thought to have sepsis at admission due to elevated WBC, fever, tachycardia.  Sepsis physiology has resolved.   Normocytic anemia Likely due to acute illness.  No evidence of overt bleeding.  Anemia panel reviewed.  Ferritin 336.  B12 804.  Folate 16.8.  Iron 27 with a TIBC of 2.5.  Percent saturation is 12.  Mildly abnormal LFTs with mildly elevated ALT and alkaline phosphatase No obvious abnormalities noted on CT scan.  AST and ALT now normal.  Alkaline phosphatase mildly elevated and improving.   Incidental finding of hepatic steatosis as well as renal cysts noted on CT scan.  Overall stable.  Okay for discharge home today.     PERTINENT LABS:  The results of significant diagnostics from this hospitalization (including imaging, microbiology, ancillary and laboratory) are listed below for reference.    Microbiology: Recent Results (from the past 240 hour(s))  Blood culture (routine x 2)     Status: None (Preliminary result)   Collection Time: 10/10/17  1:09 PM  Result Value Ref Range Status   Specimen Description BLOOD LEFT ANTECUBITAL  Final   Special Requests   Final    BOTTLES DRAWN AEROBIC ONLY Blood Culture results may not be optimal  due to an inadequate volume of blood received in culture bottles   Culture   Final    NO GROWTH 4 DAYS Performed at [facility], [address], [place], [place] [address]    Report Status PENDING  Incomplete  Blood culture (routine x 2)     Status: None (Preliminary result)   Collection Time: 10/10/17   1:15 PM  Result Value Ref Range Status   Specimen Description BLOOD RIGHT ANTECUBITAL  Final   Special Requests   Final    BOTTLES DRAWN AEROBIC AND ANAEROBIC Blood Culture results may not be optimal due to an excessive volume of blood received in culture bottles   Culture   Final    NO GROWTH 4 DAYS Performed at [facility], [address] [place], [place] [address]    Report Status PENDING  Incomplete  Wet prep, genital     Status: Abnormal   Collection Time: 10/10/17  5:57 PM  Result Value Ref Range Status   Yeast Wet Prep HPF POC NONE SEEN NONE SEEN Final   Trich, Wet Prep NONE SEEN NONE SEEN Final   Clue Cells Wet Prep HPF POC NONE SEEN NONE SEEN Final   WBC, Wet Prep HPF POC MANY (A) NONE SEEN Final   Sperm NONE SEEN  Final    Comment: Performed at [facility], [address] [place], [place] [address]  Aerobic/Anaerobic Culture (surgical/deep wound)     Status: None (Preliminary result)   Collection Time: 10/11/17 12:53 PM  Result Value Ref Range Status   Specimen Description ABSCESS ABDOMEN  Final   Special Requests POSSIBLE TUBOVARIAN ABSCESS OR RUPTURED APPY  Final   Gram Stain   Final    ABUNDANT WBC PRESENT, PREDOMINANTLY PMN ABUNDANT GRAM POSITIVE COCCI IN PAIRS IN CHAINS Performed at [facility], [address] [place], [place] [address]    Culture   Final    FEW GROUP A STREP (S.PYOGENES) ISOLATED NO ANAEROBES ISOLATED; CULTURE IN PROGRESS FOR 5 DAYS    Report Status PENDING  Incomplete     Labs: Basic Metabolic Panel: Recent Labs  Lab 10/10/17 0954 10/10/17 2003 10/11/17 0534 10/12/17 0513 10/13/17 0512 10/14/17 0516  NA 137  --  139 137 138 139  K 4.2  --  3.6 3.3* 3.6 3.6  CL 96*  --  105 103 104 103  CO2 26  --  24 25 25 26   GLUCOSE 132*  --  103* 122* 109* 103*  BUN <5*  --  5* 5* <5* 6  CREATININE 0.83 0.71 0.72 0.71 0.74 0.68  CALCIUM 9.7  --  8.5* 8.7* 8.5* 9.0   Liver Function Tests: Recent Labs  Lab  10/10/17 0954 10/11/17 0534 10/12/17 0513 10/13/17 0512  AST 41 29 21 18   ALT 80* 53* 42 35  ALKPHOS 334* 265* 258* 206*  BILITOT 1.4* 1.0 1.1 0.8  PROT 8.1 6.6 6.8 6.5  ALBUMIN 3.2* 2.4* 2.4* 2.5*   Recent Labs  Lab 10/10/17 0954  LIPASE 94*   CBC: Recent Labs  Lab 10/10/17 2003 10/11/17 0534 10/12/17 0513 10/13/17 0512 10/14/17 0516  WBC 26.6* 18.0* 17.8* 9.6 7.0  HGB 11.8* 10.9* 10.6* 10.6* 10.8*  HCT 35.7* 33.6* 32.8* 32.3* 33.6*  MCV 89.5 89.8 90.9 89.5 90.3  PLT 398 352 371 388 407*     IMAGING STUDIES US Transvaginal Non-ob  Result Date: 10/10/2017 CLINICAL DATA:  Pelvic pain EXAM: TRANSABDOMINAL AND  TRANSVAGINAL ULTRASOUND OF PELVIS DOPPLER ULTRASOUND OF OVARIES TECHNIQUE: Both transabdominal and transvaginal ultrasound examinations of the pelvis were performed. Transabdominal technique was performed for global imaging of the pelvis including uterus, ovaries, adnexal regions, and pelvic cul-de-sac. It was necessary to proceed with endovaginal exam following the transabdominal exam to visualize the uterus endometrium and ovaries. Color and duplex Doppler ultrasound was utilized to evaluate blood flow to the ovaries. COMPARISON:  CT 10/10/2017 FINDINGS: Uterus Measurements: 7.6 x 4.6 x 5 cm. No fibroids or other mass visualized. Endometrium Thickness: 6.6 mm.  No focal abnormality visualized. Right ovary Measurements: 3.6 x 1.6 x 2.8 cm. Small septated cyst versus adjacent cysts measuring 1.8 cm. Left ovary Measurements: 3.9 x 2.9 x 3.6 cm within the left adnexa, adjacent and possibly involving left ovary and extending over the superior aspect of left uterus is a large complex mass measuring 9 x 5.5 x 8.8 cm, corresponding to complex fluid collection on CT. 1.4 cm echogenic calcification at the left adnexa corresponding to CT calcification. Pulsed Doppler evaluation of both ovaries demonstrates normal low-resistance arterial and venous waveforms. Other findings Trace free  fluid IMPRESSION: 1. Negative for ovarian torsion. 2. 9 cm complex mass within the left adnexa and adjacent to the left ovary, extending superior to the left aspect of uterus. Findings could be secondary to tubo-ovarian abscess in the correct clinical setting. Note that the complex mass cannot be distinguished from adjacent sigmoid colon on the CT and bowel perforation with abscess/secondary involvement of the left adnexa is also included in the differential. Electronically Signed   By: Jasmine Pang M.D.   On: 10/10/2017 16:58   US Pelvis Complete  Result Date: 10/10/2017 CLINICAL DATA:  Pelvic pain EXAM: TRANSABDOMINAL AND TRANSVAGINAL ULTRASOUND OF PELVIS DOPPLER ULTRASOUND OF OVARIES TECHNIQUE: Both transabdominal and transvaginal ultrasound examinations of the pelvis were performed. Transabdominal technique was performed for global imaging of the pelvis including uterus, ovaries, adnexal regions, and pelvic cul-de-sac. It was necessary to proceed with endovaginal exam following the transabdominal exam to visualize the uterus endometrium and ovaries. Color and duplex Doppler ultrasound was utilized to evaluate blood flow to the ovaries. COMPARISON:  CT 10/10/2017 FINDINGS: Uterus Measurements: 7.6 x 4.6 x 5 cm. No fibroids or other mass visualized. Endometrium Thickness: 6.6 mm.  No focal abnormality visualized. Right ovary Measurements: 3.6 x 1.6 x 2.8 cm. Small septated cyst versus adjacent cysts measuring 1.8 cm. Left ovary Measurements: 3.9 x 2.9 x 3.6 cm within the left adnexa, adjacent and possibly involving left ovary and extending over the superior aspect of left uterus is a large complex mass measuring 9 x 5.5 x 8.8 cm, corresponding to complex fluid collection on CT. 1.4 cm echogenic calcification at the left adnexa corresponding to CT calcification. Pulsed Doppler evaluation of both ovaries demonstrates normal low-resistance arterial and venous waveforms. Other findings Trace free fluid IMPRESSION:  1. Negative for ovarian torsion. 2. 9 cm complex mass within the left adnexa and adjacent to the left ovary, extending superior to the left aspect of uterus. Findings could be secondary to tubo-ovarian abscess in the correct clinical setting. Note that the complex mass cannot be distinguished from adjacent sigmoid colon on the CT and bowel perforation with abscess/secondary involvement of the left adnexa is also included in the differential. Electronically Signed   By: Jasmine Pang M.D.   On: 10/10/2017 16:58   Ct Abdomen Pelvis W Contrast  Result Date: 10/10/2017 CLINICAL DATA:  Leukocytosis. Right lower quadrant and upper quadrant  pain. EXAM: CT ABDOMEN AND PELVIS WITH CONTRAST TECHNIQUE: Multidetector CT imaging of the abdomen and pelvis was performed using the standard protocol following bolus administration of intravenous contrast. CONTRAST:  ISOVUE-300 IOPAMIDOL (ISOVUE-300) INJECTION 61% COMPARISON:  None. FINDINGS: Lower chest: Left basilar linear atelectasis or scarring is present. The heart size is normal. No significant pleural or pericardial effusion is present. Hepatobiliary: Fatty infiltration of the liver is suggested. No discrete lesions are present. There is no biliary dilation. Common bile duct is normal. Pancreas: Unremarkable. No pancreatic ductal dilatation or surrounding inflammatory changes. Spleen: Normal in size without focal abnormality. Adrenals/Urinary Tract: Adrenal glands are normal. A 12 mm simple cyst is present in the midportion of the left kidney. A second cyst measures 6 mm. Two subcentimeter cysts are present at the lower pole of the right kidney. Ureters are within normal limits bilaterally. The urinary bladder is normal. Stomach/Bowel: The stomach and duodenum are within normal limits. There is mild dilation of distal small bowel, likely ileus adjacent to the peripherally enhancing abscess. No obstruction is present. Terminal ileum is somewhat thickened. The appendix  is visualized and within normal limits. The ascending and transverse colon are within normal limits. The proximal descending colon is mostly collapsed. The sigmoid colon passes immediately adjacent to the peripherally enhancing collection. Vascular/Lymphatic: Subcentimeter left iliac nodes are likely reactive. No significant adenopathy is present. Reproductive: The uterus is somewhat edematous. A multiloculated peripherally enhancing fluid collection lies immediately superior to the uterus and about the left adnexa. The largest component measures 5.8 x 4.2 x 3.2 cm. A 12 mm calcification is also present in the left adnexa. The right adnexa is unremarkable. Other: No layering free fluid is present. There is edema in the distal small bowel mesentery adjacent to the collection. No free air is present. Musculoskeletal: Vertebral body heights are normal. No focal lytic or blastic lesions are present. The bony pelvis is intact. The hips are located and within normal limits. IMPRESSION: 1. Complex peripherally enhancing fluid collection centered about the left adnexa and uterus may be loculated. The largest component measures 5.8 x 4.2 x 2.7 cm. This most likely represents a complicated tubo-ovarian abscess. Complicated sigmoid colitis is considered less likely without other inflammatory changes about the colon. The colon appears to be collapsed and passes adjacent to the collection. 2. 12 mm calcification in the left adnexa likely represents a dystrophic left ovarian calcification. 3. Mild dilation of distal small bowel adjacent to the collection likely represents ileus and secondary inflammation. 4. Edema is present in the distal small bowel mesentery adjacent to the collection. 5. Probable hepatic steatosis. 6. Bilateral renal cysts appear benign. These results were called by telephone at the time of interpretation on 10/10/2017 at 2:42 pm to [facility], who verbally acknowledged these results. Electronically Signed    By: Marin Roberts M.D.   On: 10/10/2017 14:48   [place] Art/ven Flow Abd Pelv Doppler  Result Date: 10/10/2017 CLINICAL DATA:  Pelvic pain EXAM: TRANSABDOMINAL AND TRANSVAGINAL ULTRASOUND OF PELVIS DOPPLER ULTRASOUND OF OVARIES TECHNIQUE: Both transabdominal and transvaginal ultrasound examinations of the pelvis were performed. Transabdominal technique was performed for global imaging of the pelvis including uterus, ovaries, adnexal regions, and pelvic cul-de-sac. It was necessary to proceed with endovaginal exam following the transabdominal exam to visualize the uterus endometrium and ovaries. Color and duplex Doppler ultrasound was utilized to evaluate blood flow to the ovaries. COMPARISON:  CT 10/10/2017 FINDINGS: Uterus Measurements: 7.6 x 4.6 x 5 cm. No fibroids  or other mass visualized. Endometrium Thickness: 6.6 mm.  No focal abnormality visualized. Right ovary Measurements: 3.6 x 1.6 x 2.8 cm. Small septated cyst versus adjacent cysts measuring 1.8 cm. Left ovary Measurements: 3.9 x 2.9 x 3.6 cm within the left adnexa, adjacent and possibly involving left ovary and extending over the superior aspect of left uterus is a large complex mass measuring 9 x 5.5 x 8.8 cm, corresponding to complex fluid collection on CT. 1.4 cm echogenic calcification at the left adnexa corresponding to CT calcification. Pulsed Doppler evaluation of both ovaries demonstrates normal low-resistance arterial and venous waveforms. Other findings Trace free fluid IMPRESSION: 1. Negative for ovarian torsion. 2. 9 cm complex mass within the left adnexa and adjacent to the left ovary, extending superior to the left aspect of uterus. Findings could be secondary to tubo-ovarian abscess in the correct clinical setting. Note that the complex mass cannot be distinguished from adjacent sigmoid colon on the CT and bowel perforation with abscess/secondary involvement of the left adnexa is also included in the differential. Electronically  Signed   By: Jasmine Pang M.D.   On: 10/10/2017 16:58   Ct Image Guided Drainage By Percutaneous Catheter  Result Date: 10/11/2017 INDICATION: 42 year old female with a history of pelvic abscess EXAM: CT GUIDED DRAINAGE OF  ABSCESS MEDICATIONS: The patient is currently admitted to the hospital and receiving intravenous antibiotics. The antibiotics were administered within an appropriate time frame prior to the initiation of the procedure. ANESTHESIA/SEDATION: 2.0 mg IV Versed 100 mcg IV Fentanyl Moderate Sedation Time:  10 minutes The patient was continuously monitored during the procedure by the interventional radiology nurse under my direct supervision. COMPLICATIONS: None TECHNIQUE: Informed written consent was obtained from the patient after a thorough discussion of the procedural risks, benefits and alternatives. All questions were addressed. Maximal Sterile Barrier Technique was utilized including caps, mask, sterile gowns, sterile gloves, sterile drape, hand hygiene and skin antiseptic. A timeout was performed prior to the initiation of the procedure. PROCEDURE: The low abdomen/pelvis was prepped with chlorhexidine in a sterile fashion, and a sterile drape was applied covering the operative field. A sterile gown and sterile gloves were used for the procedure. Local anesthesia was provided with 1% Lidocaine. Once the patient is prepped and draped in the usual sterile fashion. 1% lidocaine was used for local anesthesia. Trocar needle was advanced with CT guidance into the fluid collection from anterior abdominal approach. Once we confirmed location of the needle, modified Seldinger technique was used to place a 10 [place] drain into the pelvic fluid collection. Aspiration of approximately 90 cc of purulent material was performed. Catheter was sutured in position attached to bulb suction drainage. Sterile dressing was placed. Patient tolerated the procedure well and remained hemodynamically stable throughout.  Final CT was performed. No complications were encountered and no significant blood loss. FINDINGS: CT demonstrates relatively persistence size of pelvic fluid collection before drainage. Status post 10 French drain placement, there is reduction in size of the abscess cavity with the drain centered within the largest component adjacent to the uterus. Note that the dystrophic calcification of the left pelvis remains in place. Uncertain if this does represent ovarian calcification, or potentially a appendicular at in the setting of ruptured appendicitis. IMPRESSION: Status post CT-guided drainage of pelvic abscess. Sample was sent to the lab for analysis. Signed, Yvone Neu. Reyne Dumas, RPVI [facility] Radiology Electronically Signed   By: Gilmer Mor D.O.   On: 10/11/2017 13:57  DISCHARGE EXAMINATION: Vitals:   10/13/17 0517 10/13/17 1300 10/13/17 2109 10/14/17 0609  BP: 132/80 135/84 133/78 (!) 144/94  Pulse: 76 67 72 63  Resp: 16  17 18   Temp: 98.3 F (36.8 C) 97.9 F (36.6 C) 98.1 F (36.7 C) 97.9 F (36.6 C)  TempSrc: Oral Oral Oral Oral  SpO2: 100% 100% 100% 100%  Weight:      Height:       General appearance: alert, cooperative, appears stated age and no distress Resp: clear to auscultation bilaterally Cardio: regular rate and rhythm, S1, S2 normal, no murmur, click, rub or gallop GI: soft, non-tender; bowel sounds normal; no masses,  no organomegaly  DISPOSITION: Home with family  Discharge Instructions    Call MD for:  difficulty breathing, headache or visual disturbances   Complete by:  As directed    Call MD for:  extreme fatigue   Complete by:  As directed    Call MD for:  persistant dizziness or light-headedness   Complete by:  As directed    Call MD for:  persistant nausea and vomiting   Complete by:  As directed    Call MD for:  severe uncontrolled pain   Complete by:  As directed    Call MD for:  temperature >100.4    Complete by:  As directed    Diet general   Complete by:  As directed    Discharge instructions   Complete by:  As directed    Please call [phone] or [phone] with any drain related questions and if you don't hear from them regarding follow up appointment. Take your medications as prescribed. Please arrange appointment with a GYN in the next 1-2 weeks.  You were cared for by a hospitalist during your hospital stay. If you have any questions about your discharge medications or the care you received while you were in the hospital after you are discharged, you can call the unit and asked to speak with the hospitalist on call if the hospitalist that took care of you is not available. Once you are discharged, your primary care physician will handle any further medical issues. Please note that NO REFILLS for any discharge medications will be authorized once you are discharged, as it is imperative that you return to your primary care physician (or establish a relationship with a primary care physician if you do not have one) for your aftercare needs so that they can reassess your need for medications and monitor your lab values. If you do not have a primary care physician, you can call [phone] for a physician referral.   Increase activity slowly   Complete by:  As directed         Allergies as of 10/14/2017   No Known Allergies     Medication List    STOP taking these medications   nitrofurantoin (macrocrystal-monohydrate) 100 MG capsule Commonly known as:  MACROBID     TAKE these medications   amoxicillin-clavulanate 875-125 MG tablet Commonly known as:  AUGMENTIN Take 1 tablet by mouth every 12 (twelve) hours for 14 days.   multivitamin with minerals tablet Take 1 tablet by mouth daily.   saccharomyces boulardii 250 MG capsule Commonly known as:  FLORASTOR Take 1 capsule (250 mg total) by mouth 2 (two) times daily.   traMADol 50 MG tablet Commonly known as:  ULTRAM Take 1 tablet  (50 mg total) by mouth every 12 (twelve) hours as needed for up to 15 days for moderate  pain.   vitamin E 400 UNIT capsule Take 400 Units by mouth daily.        Follow-up Information    Harriette Bouillon, MD Follow up on 10/31/2017.   Specialty:  General Surgery Why:  Your appointment is at 9:20 AM.  Be at the office 30 minutes early for check in.  Bring photo ID and insurance information.   Contact information: [address] [place] [address] [phone]        Obtain a Gynocology physician to help with your care. Follow up.           TOTAL DISCHARGE TIME: 35 minutes  Osvaldo Shipper  [facility] Pager [phone]  10/14/2017, 2:38 PM

## 2017-10-14 NOTE — Progress Notes (Signed)
Patient discharged to home with instructions. 

## 2017-10-15 LAB — CULTURE, BLOOD (ROUTINE X 2)
Culture: NO GROWTH
Culture: NO GROWTH

## 2017-10-16 LAB — AEROBIC/ANAEROBIC CULTURE (SURGICAL/DEEP WOUND)

## 2017-10-16 LAB — AEROBIC/ANAEROBIC CULTURE W GRAM STAIN (SURGICAL/DEEP WOUND)

## 2017-10-22 ENCOUNTER — Ambulatory Visit
Admission: RE | Admit: 2017-10-22 | Discharge: 2017-10-22 | Disposition: A | Discharge status: Home / Self Care | Payer: 59 | Source: Ambulatory Visit | Attending: Surgery | Admitting: Surgery

## 2017-10-22 ENCOUNTER — Ambulatory Visit
Admission: RE | Admit: 2017-10-22 | Discharge: 2017-10-22 | Disposition: A | Discharge status: Home / Self Care | Payer: 59 | Source: Ambulatory Visit | Attending: Radiology | Admitting: Radiology

## 2017-10-22 ENCOUNTER — Encounter: Payer: Self-pay | Admitting: Radiology

## 2017-10-22 DIAGNOSIS — N739 Female pelvic inflammatory disease, unspecified: Secondary | ICD-10-CM

## 2017-10-22 HISTORY — PX: IR RADIOLOGIST EVAL & MGMT: IMG5224

## 2017-10-22 MED ORDER — IOPAMIDOL (ISOVUE-300) INJECTION 61%
100.0000 mL | Freq: Once | INTRAVENOUS | Status: AC | PRN
  Administered 2017-10-22: 100 mL via INTRAVENOUS

## 2017-10-22 NOTE — Progress Notes (Signed)
Chief Complaint: Follow up abscess drain  Referring Physician(s): Dr. Magnus Ivan  History of Present Illness: Angel Anderson is a 42 y.o. female presenting today for follow up of an abscess drain placed 10/11/2017.   She was admitted to [facility] 10/10/2017 with low grade fever and new abd pain.  CT revealed pelvic abscess, with drain placed on 9/7.   The cultures have grown strep pyogenes.    Surgery followed in the hospital, and she has new care established with OB/GYN as outpt.  Her follow up with both services is coming up.    The abscess is being treated as tubo-ovarian abscess.   She denies fevers/rigors/chills.  She has recorded output daily with flushes, which is below 10cc per day.  She is feeling better overall.    CT shows complete resolution of the infection.   No past medical history on file.  No past surgical history on file.  Allergies: Patient has no known allergies.  Medications: Prior to Admission medications   Medication Sig Start Date End Date Taking? Authorizing Provider  amoxicillin-clavulanate (AUGMENTIN) 875-125 MG tablet Take 1 tablet by mouth every 12 (twelve) hours for 14 days. 10/14/17 10/28/17  Osvaldo Shipper, MD  Multiple Vitamins-Minerals (MULTIVITAMIN WITH MINERALS) tablet Take 1 tablet by mouth daily.    [provider]  saccharomyces boulardii (FLORASTOR) 250 MG capsule Take 1 capsule (250 mg total) by mouth 2 (two) times daily. 10/14/17   Osvaldo Shipper, MD  traMADol (ULTRAM) 50 MG tablet Take 1 tablet (50 mg total) by mouth every 12 (twelve) hours as needed for up to 15 days for moderate pain. 10/14/17 10/29/17  Osvaldo Shipper, MD  vitamin E 400 UNIT capsule Take 400 Units by mouth daily.    [provider]     No family history on file.  Social History   Socioeconomic History  . Marital status: Married    Spouse name: Not on file  . Number of children: Not on file  . Years of education: Not on file  . Highest education  level: Not on file  Occupational History  . Not on file  Social Needs  . Financial resource strain: Not on file  . Food insecurity:    Worry: Not on file    Inability: Not on file  . Transportation needs:    Medical: Not on file    Non-medical: Not on file  Tobacco Use  . Smoking status: Never Smoker  . Smokeless tobacco: Never Used  Substance and Sexual Activity  . Alcohol use: Never    Frequency: Never  . Drug use: Not on file  . Sexual activity: Not on file  Lifestyle  . Physical activity:    Days per week: Not on file    Minutes per session: Not on file  . Stress: Not on file  Relationships  . Social connections:    Talks on phone: Not on file    Gets together: Not on file    Attends religious service: Not on file    Active member of club or organization: Not on file    Attends meetings of clubs or organizations: Not on file    Relationship status: Not on file  Other Topics Concern  . Not on file  Social History Narrative  . Not on file      Review of Systems: A 12 point ROS discussed and pertinent positives are indicated in the HPI above.  All other systems are negative.  Review of  Systems  Vital Signs: LMP 10/01/2017 Comment: pt had tubes removed  Physical Exam Targeted exam shows no infection at the drain site.   Mallampati Score:     Imaging: US Transvaginal Non-ob  Result Date: 10/10/2017 CLINICAL DATA:  Pelvic pain EXAM: TRANSABDOMINAL AND TRANSVAGINAL ULTRASOUND OF PELVIS DOPPLER ULTRASOUND OF OVARIES TECHNIQUE: Both transabdominal and transvaginal ultrasound examinations of the pelvis were performed. Transabdominal technique was performed for global imaging of the pelvis including uterus, ovaries, adnexal regions, and pelvic cul-de-sac. It was necessary to proceed with endovaginal exam following the transabdominal exam to visualize the uterus endometrium and ovaries. Color and duplex Doppler ultrasound was utilized to evaluate blood flow to the  ovaries. COMPARISON:  CT 10/10/2017 FINDINGS: Uterus Measurements: 7.6 x 4.6 x 5 cm. No fibroids or other mass visualized. Endometrium Thickness: 6.6 mm.  No focal abnormality visualized. Right ovary Measurements: 3.6 x 1.6 x 2.8 cm. Small septated cyst versus adjacent cysts measuring 1.8 cm. Left ovary Measurements: 3.9 x 2.9 x 3.6 cm within the left adnexa, adjacent and possibly involving left ovary and extending over the superior aspect of left uterus is a large complex mass measuring 9 x 5.5 x 8.8 cm, corresponding to complex fluid collection on CT. 1.4 cm echogenic calcification at the left adnexa corresponding to CT calcification. Pulsed Doppler evaluation of both ovaries demonstrates normal low-resistance arterial and venous waveforms. Other findings Trace free fluid IMPRESSION: 1. Negative for ovarian torsion. 2. 9 cm complex mass within the left adnexa and adjacent to the left ovary, extending superior to the left aspect of uterus. Findings could be secondary to tubo-ovarian abscess in the correct clinical setting. Note that the complex mass cannot be distinguished from adjacent sigmoid colon on the CT and bowel perforation with abscess/secondary involvement of the left adnexa is also included in the differential. Electronically Signed   By: Jasmine Pang M.D.   On: 10/10/2017 16:58   US Pelvis Complete  Result Date: 10/10/2017 CLINICAL DATA:  Pelvic pain EXAM: TRANSABDOMINAL AND TRANSVAGINAL ULTRASOUND OF PELVIS DOPPLER ULTRASOUND OF OVARIES TECHNIQUE: Both transabdominal and transvaginal ultrasound examinations of the pelvis were performed. Transabdominal technique was performed for global imaging of the pelvis including uterus, ovaries, adnexal regions, and pelvic cul-de-sac. It was necessary to proceed with endovaginal exam following the transabdominal exam to visualize the uterus endometrium and ovaries. Color and duplex Doppler ultrasound was utilized to evaluate blood flow to the ovaries.  COMPARISON:  CT 10/10/2017 FINDINGS: Uterus Measurements: 7.6 x 4.6 x 5 cm. No fibroids or other mass visualized. Endometrium Thickness: 6.6 mm.  No focal abnormality visualized. Right ovary Measurements: 3.6 x 1.6 x 2.8 cm. Small septated cyst versus adjacent cysts measuring 1.8 cm. Left ovary Measurements: 3.9 x 2.9 x 3.6 cm within the left adnexa, adjacent and possibly involving left ovary and extending over the superior aspect of left uterus is a large complex mass measuring 9 x 5.5 x 8.8 cm, corresponding to complex fluid collection on CT. 1.4 cm echogenic calcification at the left adnexa corresponding to CT calcification. Pulsed Doppler evaluation of both ovaries demonstrates normal low-resistance arterial and venous waveforms. Other findings Trace free fluid IMPRESSION: 1. Negative for ovarian torsion. 2. 9 cm complex mass within the left adnexa and adjacent to the left ovary, extending superior to the left aspect of uterus. Findings could be secondary to tubo-ovarian abscess in the correct clinical setting. Note that the complex mass cannot be distinguished from adjacent sigmoid colon on the CT and bowel perforation with  abscess/secondary involvement of the left adnexa is also included in the differential. Electronically Signed   By: Jasmine Pang M.D.   On: 10/10/2017 16:58   Ct Abdomen Pelvis W Contrast  Result Date: 10/22/2017 CLINICAL DATA:  42 year old female with a history of prior abscess. Prior drainage performed 10/11/2017. The patient returns today for evaluation with less than 10 cc per day output with flushes. EXAM: CT ABDOMEN AND PELVIS WITH CONTRAST TECHNIQUE: Multidetector CT imaging of the abdomen and pelvis was performed using the standard protocol following bolus administration of intravenous contrast. CONTRAST:  ISOVUE-300 IOPAMIDOL (ISOVUE-300) INJECTION 61% COMPARISON:  CT 10/10/2017, 10/11/2017 FINDINGS: Lower chest: No acute abnormality. Hepatobiliary: Unremarkable appearance  of liver. Unremarkable gallbladder. Pancreas: Unremarkable pancreas Spleen: Unremarkable spleen Adrenals/Urinary Tract: Unremarkable adrenal glands. Bilateral kidneys demonstrate punctate density at the papillary region believed to represent nonobstructive nephrolithiasis given the persistence from the CT 10/10/2017 2 today's CT. Alternatively these could represent early contrast excretion, though stones are favored. No evidence of hydronephrosis or inflammatory changes. Bosniak 1 cyst of the lower left kidney. Stomach/Bowel: Unremarkable stomach. Unremarkable small bowel. Unremarkable colon. Normal appendix identified. Vascular/Lymphatic: No significant atherosclerotic changes of the arteries. Unremarkable veins. Reproductive: Minimal fluid within the endometrial canal. Low-density cystic structure associated with the right adnexa measures 21 mm. Coarse calcification associated with the left adnexa is again present, unchanged. Complete resolution of inflammation associated with the adnexa and posterior to the uterus. No residual fluid collection adjacent to the drain which remains in position. No extraluminal air or residual fluid/inflammation. Other: No hernia. Musculoskeletal: No acute fracture.  Minimal degenerative changes. IMPRESSION: Complete resolution of the previously identified abscess, with resolution of inflammatory changes of the adnexa. Likely physiologic changes of the right adnexa. Normal appendix identified. Small nonobstructive bilateral nephrolithiasis. Electronically Signed   By: Gilmer Mor D.O.   On: 10/22/2017 10:15   Ct Abdomen Pelvis W Contrast  Result Date: 10/10/2017 CLINICAL DATA:  Leukocytosis. Right lower quadrant and upper quadrant pain. EXAM: CT ABDOMEN AND PELVIS WITH CONTRAST TECHNIQUE: Multidetector CT imaging of the abdomen and pelvis was performed using the standard protocol following bolus administration of intravenous contrast. CONTRAST:  ISOVUE-300 IOPAMIDOL  (ISOVUE-300) INJECTION 61% COMPARISON:  None. FINDINGS: Lower chest: Left basilar linear atelectasis or scarring is present. The heart size is normal. No significant pleural or pericardial effusion is present. Hepatobiliary: Fatty infiltration of the liver is suggested. No discrete lesions are present. There is no biliary dilation. Common bile duct is normal. Pancreas: Unremarkable. No pancreatic ductal dilatation or surrounding inflammatory changes. Spleen: Normal in size without focal abnormality. Adrenals/Urinary Tract: Adrenal glands are normal. A 12 mm simple cyst is present in the midportion of the left kidney. A second cyst measures 6 mm. Two subcentimeter cysts are present at the lower pole of the right kidney. Ureters are within normal limits bilaterally. The urinary bladder is normal. Stomach/Bowel: The stomach and duodenum are within normal limits. There is mild dilation of distal small bowel, likely ileus adjacent to the peripherally enhancing abscess. No obstruction is present. Terminal ileum is somewhat thickened. The appendix is visualized and within normal limits. The ascending and transverse colon are within normal limits. The proximal descending colon is mostly collapsed. The sigmoid colon passes immediately adjacent to the peripherally enhancing collection. Vascular/Lymphatic: Subcentimeter left iliac nodes are likely reactive. No significant adenopathy is present. Reproductive: The uterus is somewhat edematous. A multiloculated peripherally enhancing fluid collection lies immediately superior to the uterus and about the left  adnexa. The largest component measures 5.8 x 4.2 x 3.2 cm. A 12 mm calcification is also present in the left adnexa. The right adnexa is unremarkable. Other: No layering free fluid is present. There is edema in the distal small bowel mesentery adjacent to the collection. No free air is present. Musculoskeletal: Vertebral body heights are normal. No focal lytic or blastic  lesions are present. The bony pelvis is intact. The hips are located and within normal limits. IMPRESSION: 1. Complex peripherally enhancing fluid collection centered about the left adnexa and uterus may be loculated. The largest component measures 5.8 x 4.2 x 2.7 cm. This most likely represents a complicated tubo-ovarian abscess. Complicated sigmoid colitis is considered less likely without other inflammatory changes about the colon. The colon appears to be collapsed and passes adjacent to the collection. 2. 12 mm calcification in the left adnexa likely represents a dystrophic left ovarian calcification. 3. Mild dilation of distal small bowel adjacent to the collection likely represents ileus and secondary inflammation. 4. Edema is present in the distal small bowel mesentery adjacent to the collection. 5. Probable hepatic steatosis. 6. Bilateral renal cysts appear benign. These results were called by telephone at the time of interpretation on 10/10/2017 at 2:42 pm to [facility], who verbally acknowledged these results. Electronically Signed   By: Marin Roberts M.D.   On: 10/10/2017 14:48   [place] Art/ven Flow Abd Pelv Doppler  Result Date: 10/10/2017 CLINICAL DATA:  Pelvic pain EXAM: TRANSABDOMINAL AND TRANSVAGINAL ULTRASOUND OF PELVIS DOPPLER ULTRASOUND OF OVARIES TECHNIQUE: Both transabdominal and transvaginal ultrasound examinations of the pelvis were performed. Transabdominal technique was performed for global imaging of the pelvis including uterus, ovaries, adnexal regions, and pelvic cul-de-sac. It was necessary to proceed with endovaginal exam following the transabdominal exam to visualize the uterus endometrium and ovaries. Color and duplex Doppler ultrasound was utilized to evaluate blood flow to the ovaries. COMPARISON:  CT 10/10/2017 FINDINGS: Uterus Measurements: 7.6 x 4.6 x 5 cm. No fibroids or other mass visualized. Endometrium Thickness: 6.6 mm.  No focal abnormality visualized. Right ovary  Measurements: 3.6 x 1.6 x 2.8 cm. Small septated cyst versus adjacent cysts measuring 1.8 cm. Left ovary Measurements: 3.9 x 2.9 x 3.6 cm within the left adnexa, adjacent and possibly involving left ovary and extending over the superior aspect of left uterus is a large complex mass measuring 9 x 5.5 x 8.8 cm, corresponding to complex fluid collection on CT. 1.4 cm echogenic calcification at the left adnexa corresponding to CT calcification. Pulsed Doppler evaluation of both ovaries demonstrates normal low-resistance arterial and venous waveforms. Other findings Trace free fluid IMPRESSION: 1. Negative for ovarian torsion. 2. 9 cm complex mass within the left adnexa and adjacent to the left ovary, extending superior to the left aspect of uterus. Findings could be secondary to tubo-ovarian abscess in the correct clinical setting. Note that the complex mass cannot be distinguished from adjacent sigmoid colon on the CT and bowel perforation with abscess/secondary involvement of the left adnexa is also included in the differential. Electronically Signed   By: Jasmine Pang M.D.   On: 10/10/2017 16:58   Ct Image Guided Drainage By Percutaneous Catheter  Result Date: 10/11/2017 INDICATION: 42 year old female with a history of pelvic abscess EXAM: CT GUIDED DRAINAGE OF  ABSCESS MEDICATIONS: The patient is currently admitted to the hospital and receiving intravenous antibiotics. The antibiotics were administered within an appropriate time frame prior to the initiation of the procedure. ANESTHESIA/SEDATION: 2.0 mg IV Versed 100  mcg IV Fentanyl Moderate Sedation Time:  10 minutes The patient was continuously monitored during the procedure by the interventional radiology nurse under my direct supervision. COMPLICATIONS: None TECHNIQUE: Informed written consent was obtained from the patient after a thorough discussion of the procedural risks, benefits and alternatives. All questions were addressed. Maximal Sterile Barrier  Technique was utilized including caps, mask, sterile gowns, sterile gloves, sterile drape, hand hygiene and skin antiseptic. A timeout was performed prior to the initiation of the procedure. PROCEDURE: The low abdomen/pelvis was prepped with chlorhexidine in a sterile fashion, and a sterile drape was applied covering the operative field. A sterile gown and sterile gloves were used for the procedure. Local anesthesia was provided with 1% Lidocaine. Once the patient is prepped and draped in the usual sterile fashion. 1% lidocaine was used for local anesthesia. Trocar needle was advanced with CT guidance into the fluid collection from anterior abdominal approach. Once we confirmed location of the needle, modified Seldinger technique was used to place a 10 [place] drain into the pelvic fluid collection. Aspiration of approximately 90 cc of purulent material was performed. Catheter was sutured in position attached to bulb suction drainage. Sterile dressing was placed. Patient tolerated the procedure well and remained hemodynamically stable throughout. Final CT was performed. No complications were encountered and no significant blood loss. FINDINGS: CT demonstrates relatively persistence size of pelvic fluid collection before drainage. Status post 10 French drain placement, there is reduction in size of the abscess cavity with the drain centered within the largest component adjacent to the uterus. Note that the dystrophic calcification of the left pelvis remains in place. Uncertain if this does represent ovarian calcification, or potentially a appendicular at in the setting of ruptured appendicitis. IMPRESSION: Status post CT-guided drainage of pelvic abscess. Sample was sent to the lab for analysis. Signed, Yvone NeuJaime S. Reyne DumasWagner, DO, RPVI [facility] Electronically Signed   By: Gilmer MorJaime  Ilyssa Grennan D.O.   On: 10/11/2017 13:57    Labs:  CBC: Recent Labs    10/11/17 0534  10/12/17 0513 10/13/17 0512 10/14/17 0516  WBC 18.0* 17.8* 9.6 7.0  HGB 10.9* 10.6* 10.6* 10.8*  HCT 33.6* 32.8* 32.3* 33.6*  PLT 352 371 388 407*    COAGS: Recent Labs    10/11/17 0929  INR 1.05    BMP: Recent Labs    10/11/17 0534 10/12/17 0513 10/13/17 0512 10/14/17 0516  NA 139 137 138 139  K 3.6 3.3* 3.6 3.6  CL 105 103 104 103  CO2 24 25 25 26   GLUCOSE 103* 122* 109* 103*  BUN 5* 5* <5* 6  CALCIUM 8.5* 8.7* 8.5* 9.0  CREATININE 0.72 0.71 0.74 0.68  GFRNONAA >60 >60 >60 >60  GFRAA >60 >60 >60 >60    LIVER FUNCTION TESTS: Recent Labs    10/10/17 0954 10/11/17 0534 10/12/17 0513 10/13/17 0512  BILITOT 1.4* 1.0 1.1 0.8  AST 41 29 21 18   ALT 80* 53* 42 35  ALKPHOS 334* 265* 258* 206*  PROT 8.1 6.6 6.8 6.5  ALBUMIN 3.2* 2.4* 2.4* 2.5*    TUMOR MARKERS: No results for input(s): AFPTM, CEA, CA199, CHROMGRNA in the last 8760 hours.  Assessment and Plan:  Angel Anderson is here today for follow up after pelvic abscess drain, presumably a tubo-ovarian abscess.    She has recovered, drain output is below 10cc per day, and CT shows complete resolution of the abscess.   Drain was removed at the bedside today.  She was given instructions for local wound care.    I did recommend that she observe  her upcoming doctors appointments on schedule.    Electronically Signed: Gilmer Mor 10/22/2017, 10:34 AM   I spent a total of  15 Minutes   in face to face in clinical consultation, greater than 50% of which was counseling/coordinating care for abscess drain, removal.

## 2017-11-12 ENCOUNTER — Other Ambulatory Visit: Payer: Self-pay | Admitting: Obstetrics and Gynecology

## 2017-11-12 DIAGNOSIS — N6489 Other specified disorders of breast: Secondary | ICD-10-CM

## 2017-11-19 ENCOUNTER — Ambulatory Visit
Admission: RE | Admit: 2017-11-19 | Discharge: 2017-11-19 | Disposition: A | Discharge status: Home / Self Care | Payer: 59 | Source: Ambulatory Visit | Attending: Obstetrics and Gynecology | Admitting: Obstetrics and Gynecology

## 2017-11-19 DIAGNOSIS — N6489 Other specified disorders of breast: Secondary | ICD-10-CM

## 2017-11-26 ENCOUNTER — Encounter: Payer: Self-pay | Admitting: Obstetrics and Gynecology

## 2017-11-26 DIAGNOSIS — N200 Calculus of kidney: Secondary | ICD-10-CM | POA: Insufficient documentation

## 2020-07-01 IMAGING — CT CT ABD-PELV W/ CM
2 of 5 series · 14 of 46 positions shown, 16 images · IV contrast (APPLIED)
Comparison: None.

CLINICAL DATA: Leukocytosis. Right lower quadrant and upper
quadrant pain.

EXAM:
CT ABDOMEN AND PELVIS WITH CONTRAST
TECHNIQUE: Multidetector CT imaging of the abdomen and pelvis was performed
using the standard protocol following bolus administration of
intravenous contrast.
CONTRAST:  100mL 9S2TBZ-PPP IOPAMIDOL (9S2TBZ-PPP) INJECTION 61%

[Series 3: abdomen 5.0 · axial · 0.68mm/px · z∈[+883,+1263]mm · 11 of 88 slices shown, 13 images]
[im 6/88  soft-tissue]
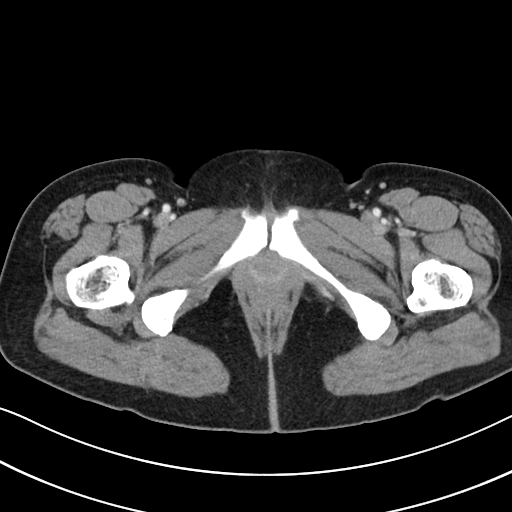
[im 6/88  bone]
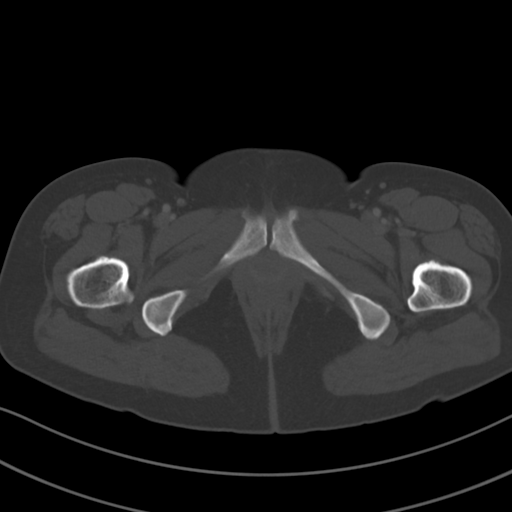
[im 17/88  soft-tissue]
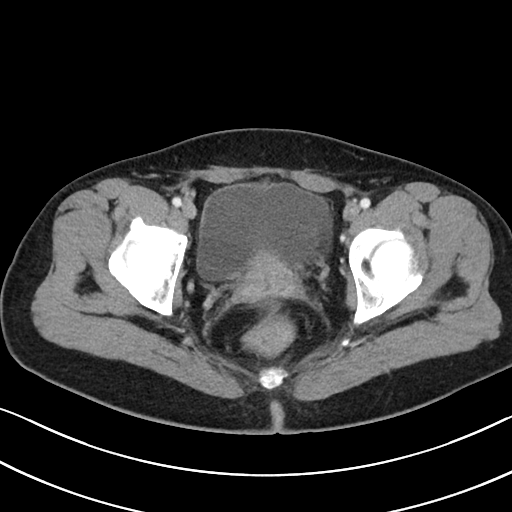
[im 22/88  soft-tissue]
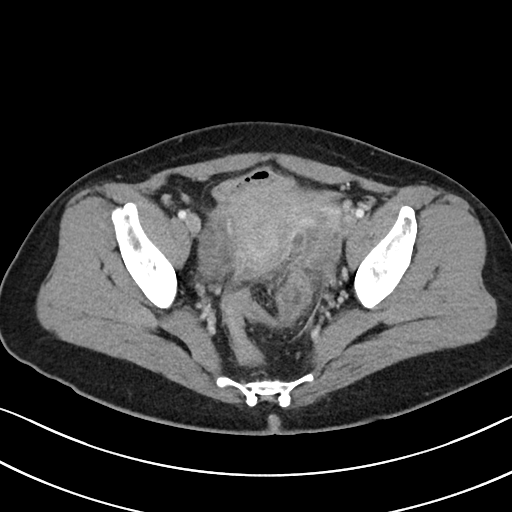
[im 28/88  soft-tissue]
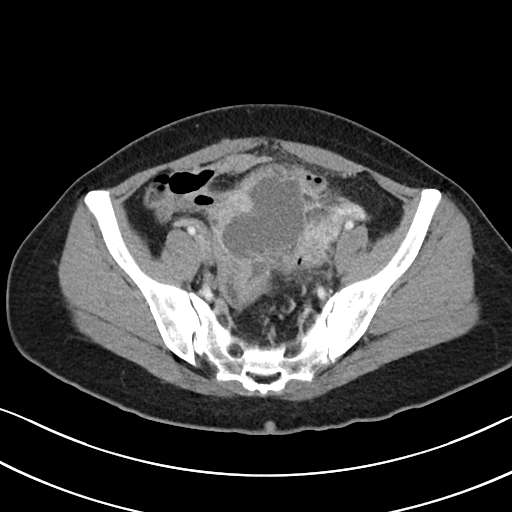
[im 39/88  soft-tissue]
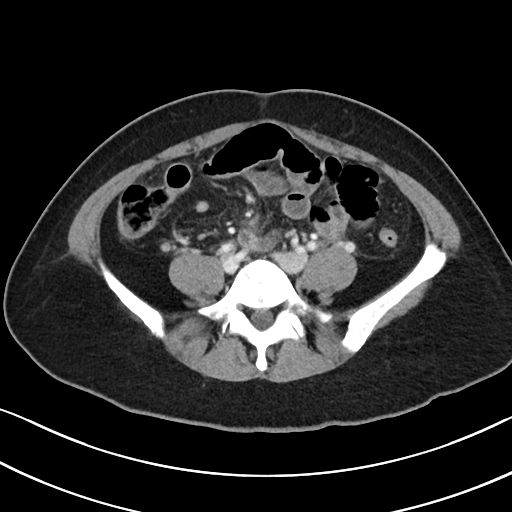
[im 44/88  soft-tissue]
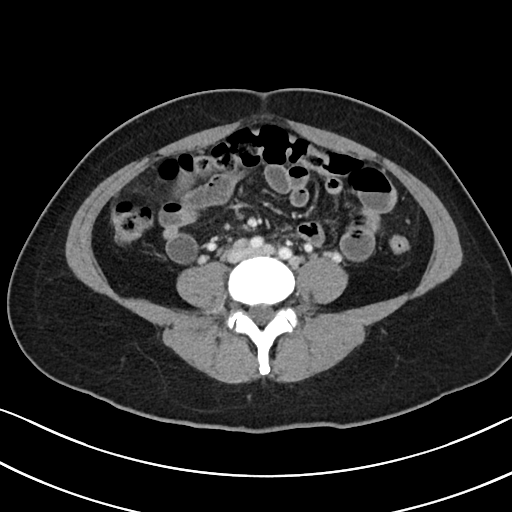
[im 49/88  soft-tissue]
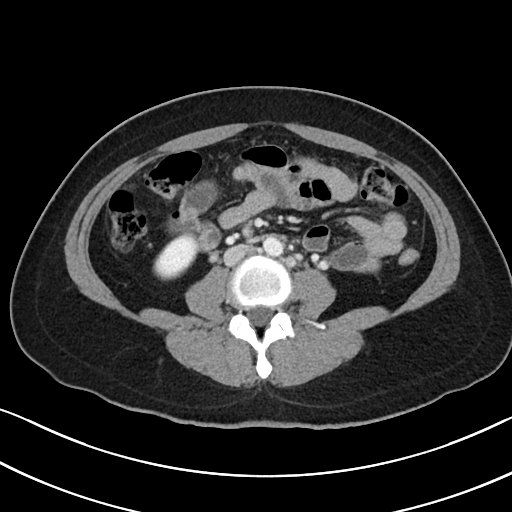
[im 60/88  soft-tissue]
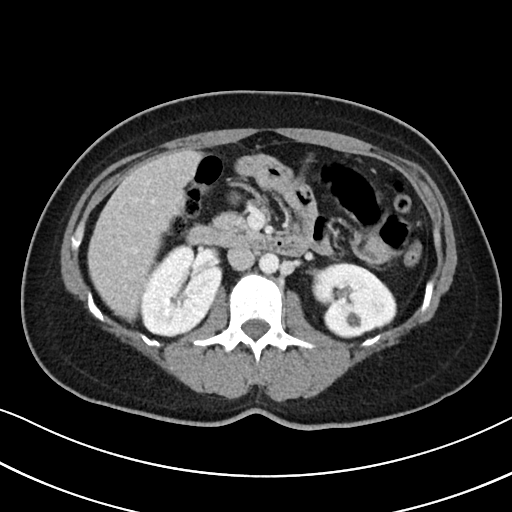
[im 66/88  soft-tissue]
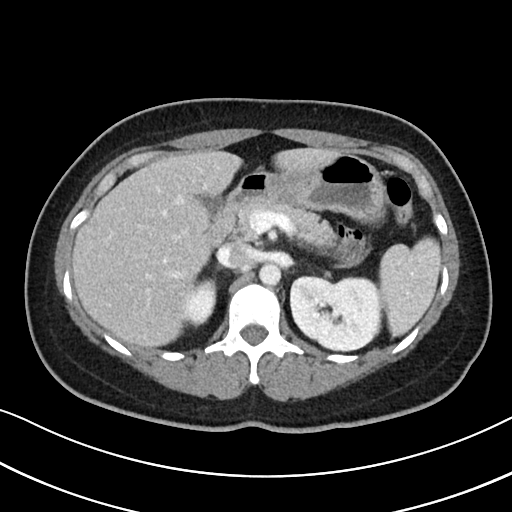
[im 66/88  bone]
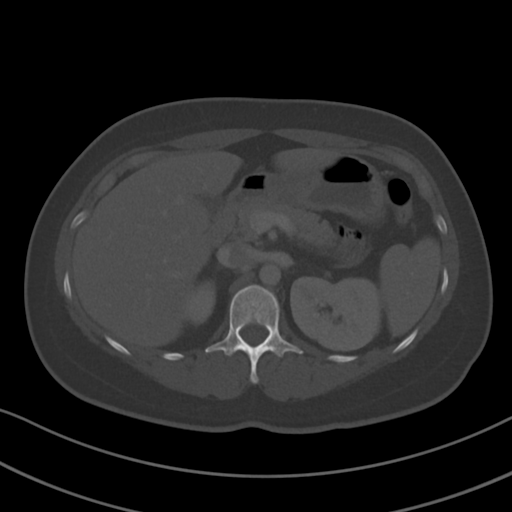
[im 71/88  soft-tissue]
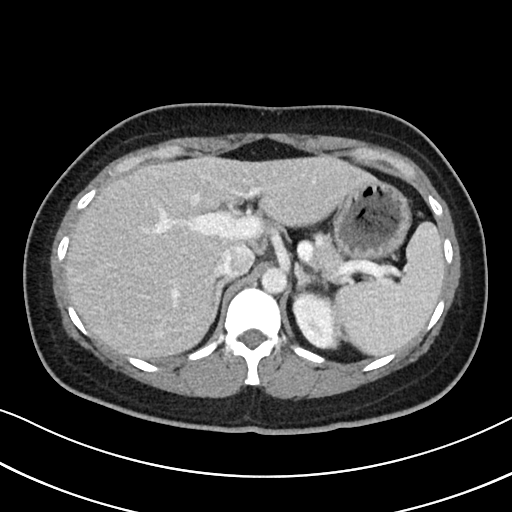
[im 82/88  soft-tissue]
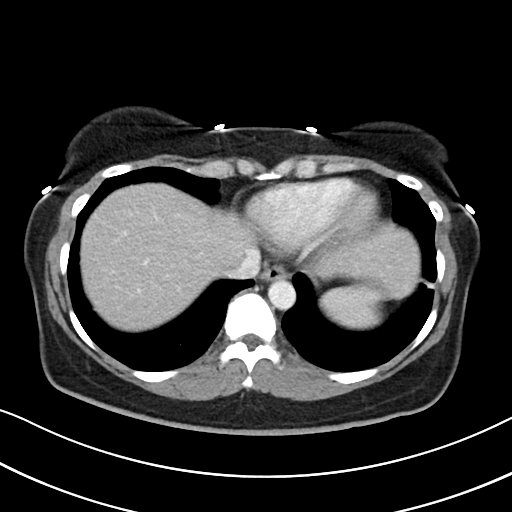

[Series 6: abdomen 3.0 mpr cor · coronal · 0.64mm/px · 3 of 101 slices shown]
[im 34/101  soft-tissue]
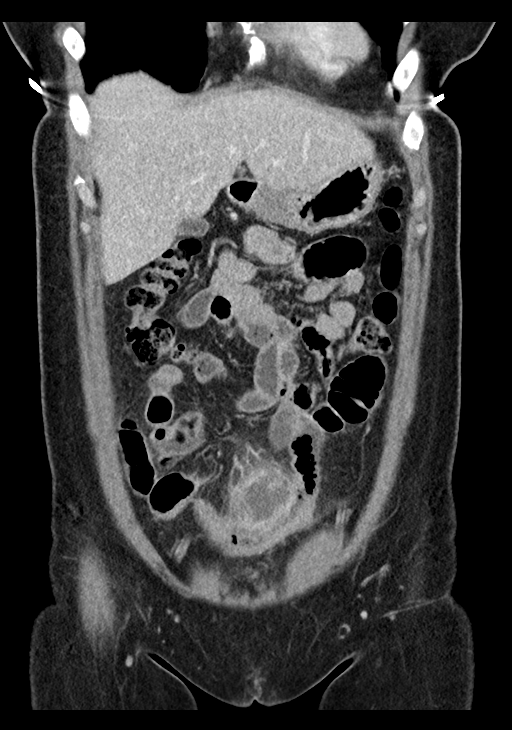
[im 45/101  soft-tissue]
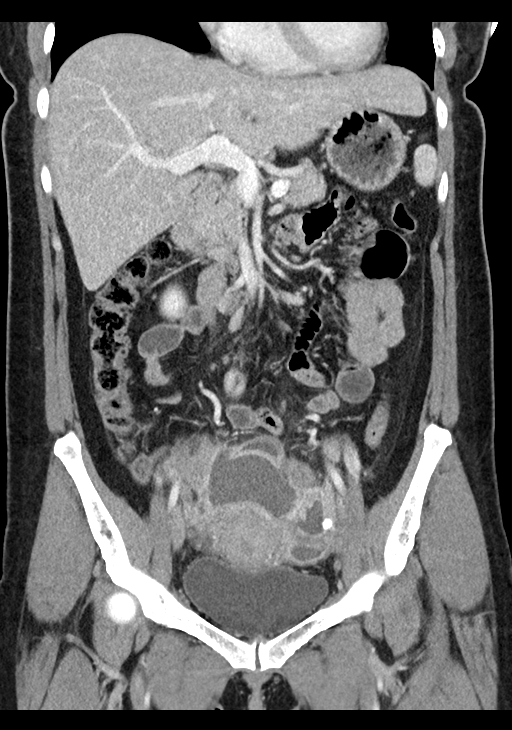
[im 56/101  soft-tissue]
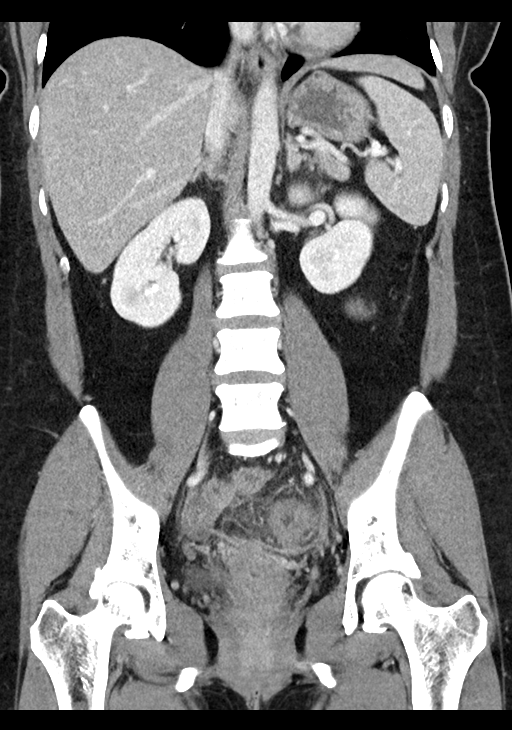

[14 of 46 positions shown; findings below may reference images not displayed]

FINDINGS: Lower chest: Left basilar linear atelectasis or scarring is present.
The heart size is normal. No significant pleural or pericardial
effusion is present.

Hepatobiliary: Fatty infiltration of the liver is suggested. No
discrete lesions are present. There is no biliary dilation. Common
bile duct is normal.

Pancreas: Unremarkable. No pancreatic ductal dilatation or
surrounding inflammatory changes.

Spleen: Normal in size without focal abnormality.

Adrenals/Urinary Tract: Adrenal glands are normal. A 12 mm simple
cyst is present in the midportion of the left kidney. A second cyst
measures 6 mm. Two subcentimeter cysts are present at the lower pole
of the right kidney. Ureters are within normal limits bilaterally.
The urinary bladder is normal.

Stomach/Bowel: The stomach and duodenum are within normal limits.
There is mild dilation of distal small bowel, likely ileus adjacent
to the peripherally enhancing abscess. No obstruction is present.
Terminal ileum is somewhat thickened. The appendix is visualized and
within normal limits. The ascending and transverse colon are within
normal limits. The proximal descending colon is mostly collapsed.
The sigmoid colon passes immediately adjacent to the peripherally
enhancing collection.

Vascular/Lymphatic: Subcentimeter left iliac nodes are likely
reactive. No significant adenopathy is present.

Reproductive: The uterus is somewhat edematous. A multiloculated
peripherally enhancing fluid collection lies immediately superior to
the uterus and about the left adnexa. The largest component measures
5.8 x 4.2 x 3.2 cm. A 12 mm calcification is also present in the
left adnexa. The right adnexa is unremarkable.

Other: No layering free fluid is present. There is edema in the
distal small bowel mesentery adjacent to the collection. No free air
is present.

Musculoskeletal: Vertebral body heights are normal. No focal lytic
or blastic lesions are present. The bony pelvis is intact. The hips
are located and within normal limits.
IMPRESSION: 1. Complex peripherally enhancing fluid collection centered about
the left adnexa and uterus may be loculated. The largest component
measures 5.8 x 4.2 x 2.7 cm. This most likely represents a
complicated tubo-ovarian abscess. Complicated sigmoid colitis is
considered less likely without other inflammatory changes about the
colon. The colon appears to be collapsed and passes adjacent to the
collection.
2. 12 mm calcification in the left adnexa likely represents a
dystrophic left ovarian calcification.
3. Mild dilation of distal small bowel adjacent to the collection
likely represents ileus and secondary inflammation.
4. Edema is present in the distal small bowel mesentery adjacent to
the collection.
5. Probable hepatic steatosis.
6. Bilateral renal cysts appear benign.

These results were called by telephone at the time of interpretation
on 10/10/2017 at [DATE] to YOEL TIGER, who verbally acknowledged
these results.

## 2020-07-02 IMAGING — CT CT IMAGE GUIDED DRAINAGE BY PERCUTANEOUS CATHETER
1 of 3 series · 13 of 32 positions shown, 18 images · non-contrast
Comparison: none

INDICATION: 41-year-old female with a history of pelvic abscess
TECHNIQUE: Informed written consent was obtained from the patient after a
thorough discussion of the procedural risks, benefits and
alternatives. All questions were addressed. Maximal Sterile Barrier
Technique was utilized including caps, mask, sterile gowns, sterile
gloves, sterile drape, hand hygiene and skin antiseptic. A timeout
was performed prior to the initiation of the procedure.

[Series 2: i-spiral 5.0 b40f · axial · 0.88mm/px · z∈[+812,+974]mm · 13 of 54 slices shown, 18 images]
[im 4/54  soft-tissue]
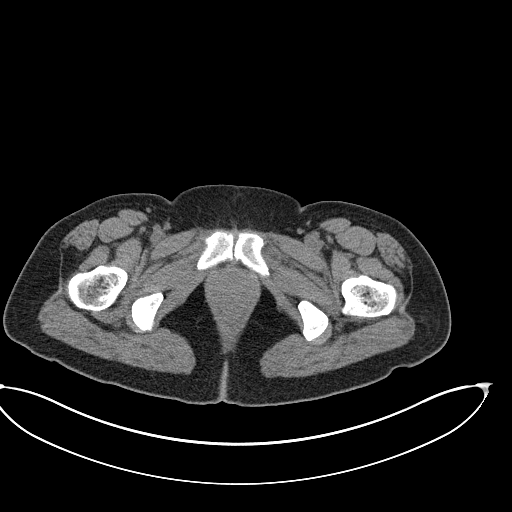
[im 4/54  bone]
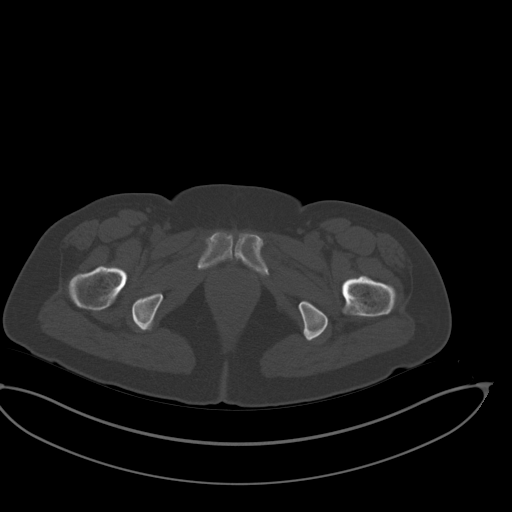
[im 8/54  soft-tissue]
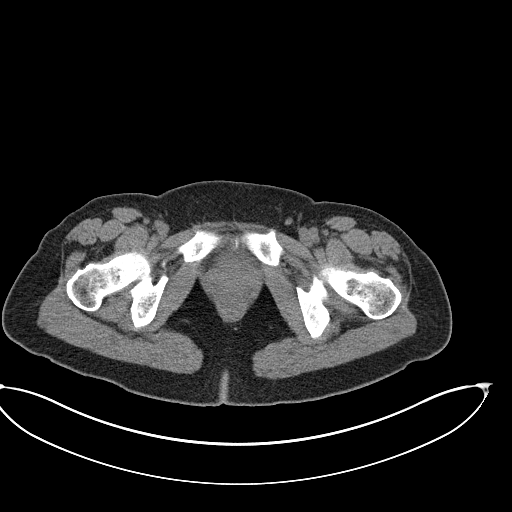
[im 11/54  soft-tissue]
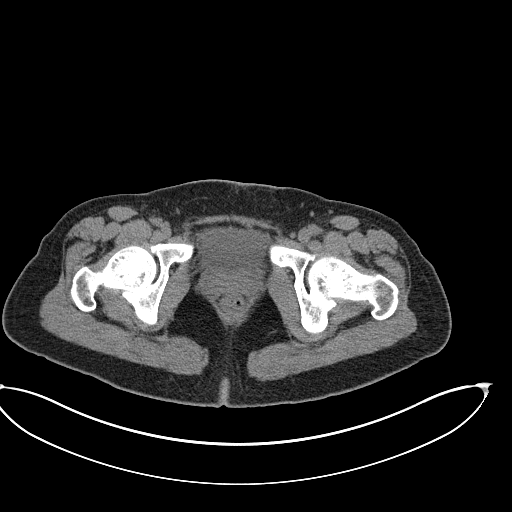
[im 18/54  soft-tissue]
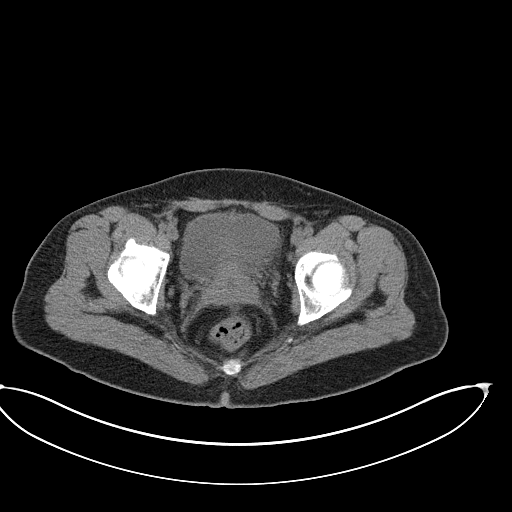
[im 22/54  soft-tissue]
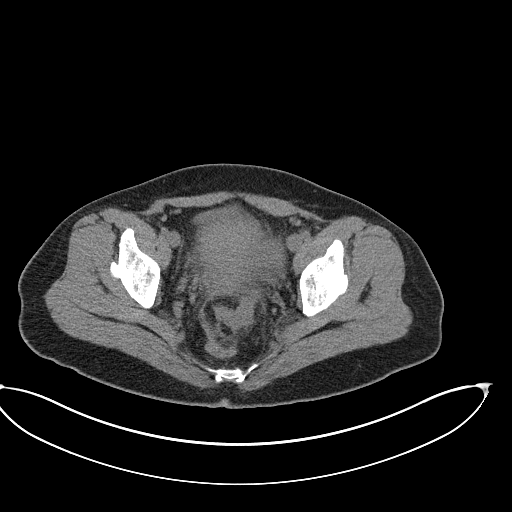
[im 25/54  soft-tissue]
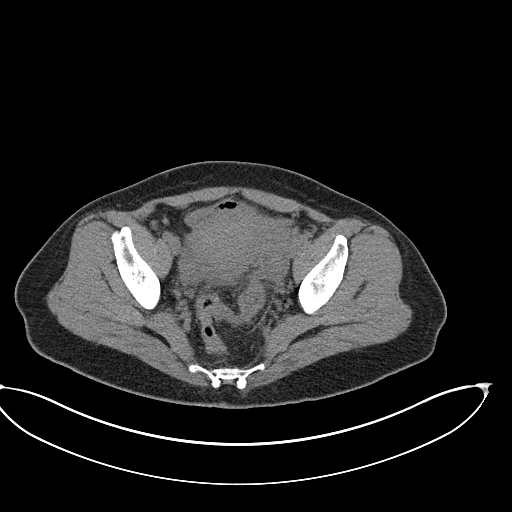
[im 29/54  soft-tissue]
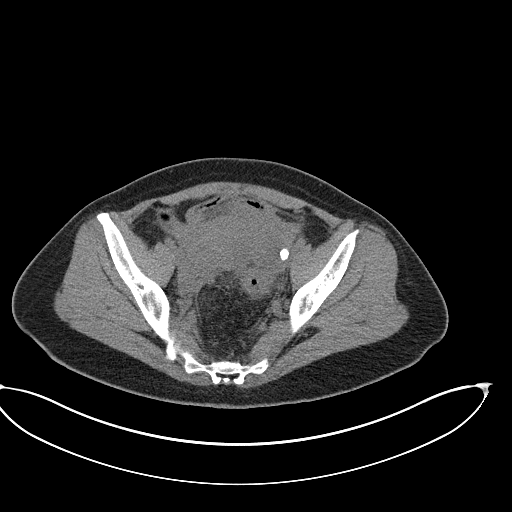
[im 32/54  soft-tissue]
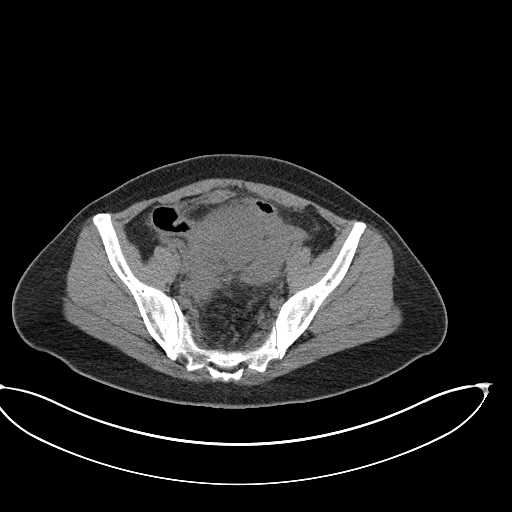
[im 36/54  soft-tissue]
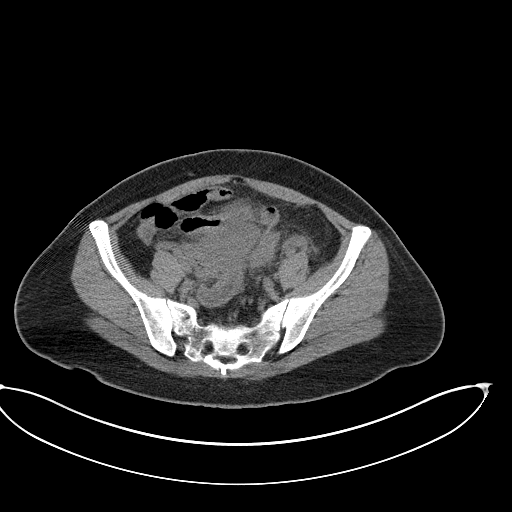
[im 36/54  bone]
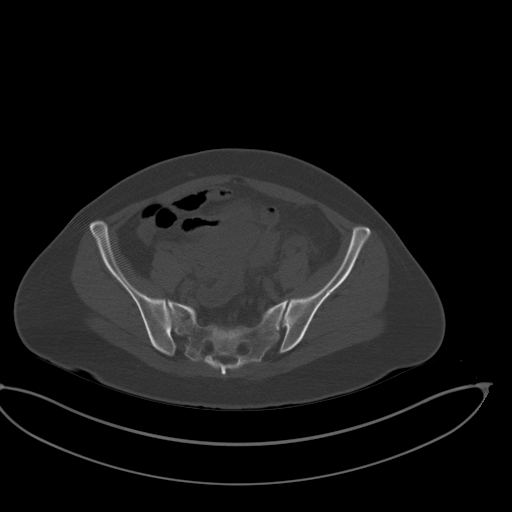
[im 39/54  lung]
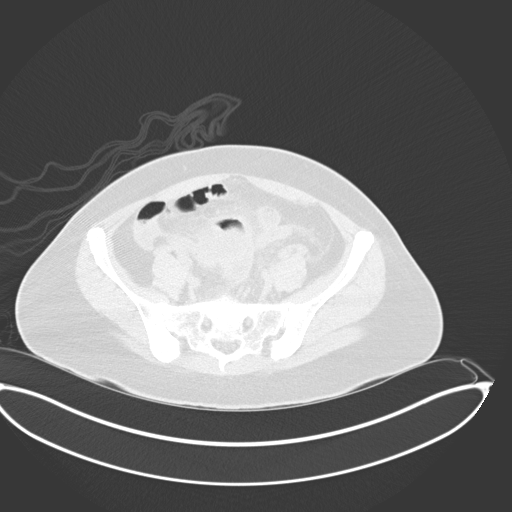
[im 43/54  soft-tissue]
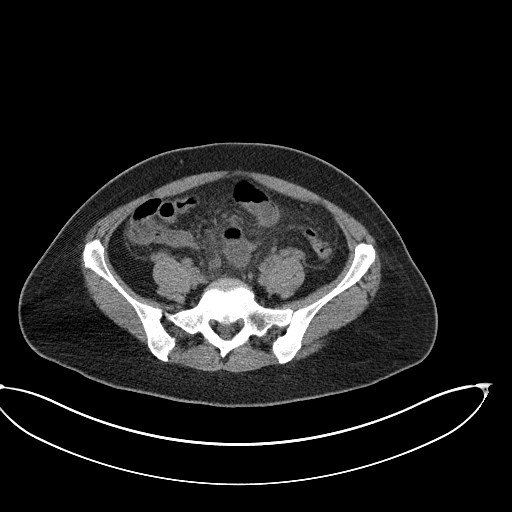
[im 43/54  lung]
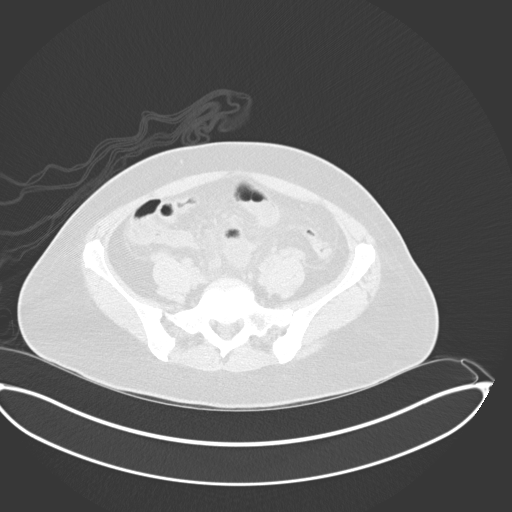
[im 46/54  soft-tissue]
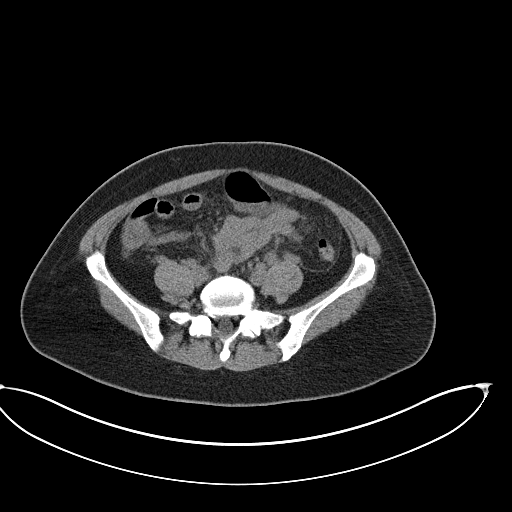
[im 46/54  lung]
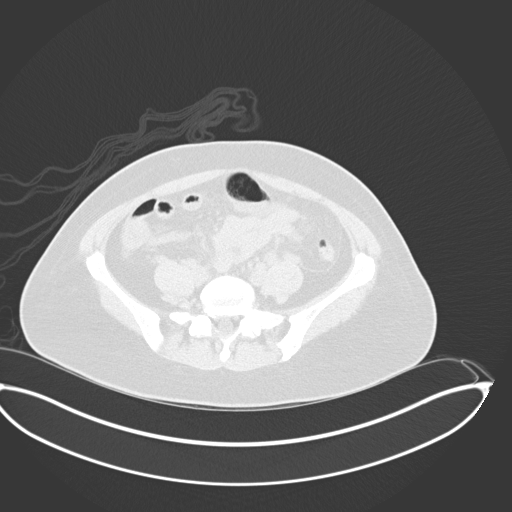
[im 50/54  soft-tissue]
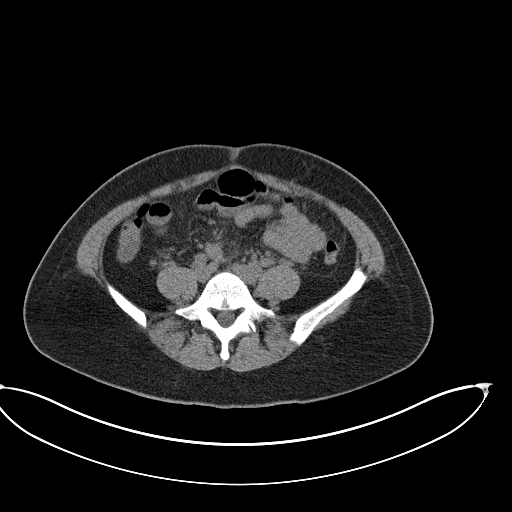
[im 50/54  lung]
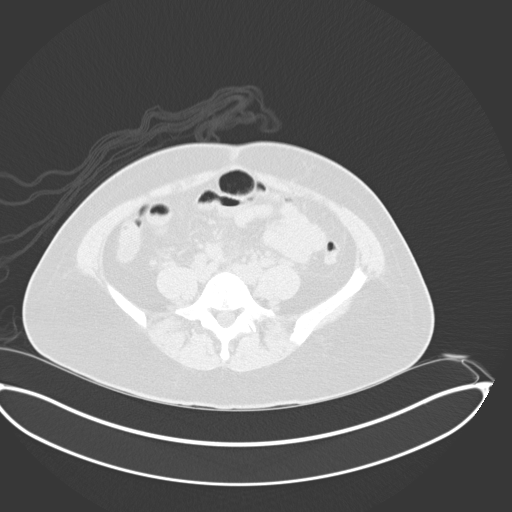

[13 of 32 positions shown; findings below may reference images not displayed]

EXAM:
CT GUIDED DRAINAGE OF  ABSCESS

MEDICATIONS:
The patient is currently admitted to the hospital and receiving
intravenous antibiotics. The antibiotics were administered within an
appropriate time frame prior to the initiation of the procedure.

ANESTHESIA/SEDATION:
2.0 mg IV Versed 100 mcg IV Fentanyl

Moderate Sedation Time:  10 minutes

The patient was continuously monitored during the procedure by the
interventional radiology nurse under my direct supervision.

COMPLICATIONS:
None
PROCEDURE:
The low abdomen/pelvis was prepped with chlorhexidine in a sterile
fashion, and a sterile drape was applied covering the operative
field. A sterile gown and sterile gloves were used for the
procedure. Local anesthesia was provided with 1% Lidocaine.

Once the patient is prepped and draped in the usual sterile fashion.
1% lidocaine was used for local anesthesia. Trocar needle was
advanced with CT guidance into the fluid collection from anterior
abdominal approach. Once we confirmed location of the needle,
modified Seldinger technique was used to place a 10 French drain
into the pelvic fluid collection.

Aspiration of approximately 90 cc of purulent material was
performed. Catheter was sutured in position attached to bulb suction
drainage.

Sterile dressing was placed.

Patient tolerated the procedure well and remained hemodynamically
stable throughout.

Final CT was performed.

No complications were encountered and no significant blood loss.
FINDINGS: CT demonstrates relatively persistence size of pelvic fluid
collection before drainage.

Status post 10 French drain placement, there is reduction in size of
the abscess cavity with the drain centered within the largest
component adjacent to the uterus.

Note that the dystrophic calcification of the left pelvis remains in
place. Uncertain if this does represent ovarian calcification, or
potentially a appendicular at in the setting of ruptured
appendicitis.
IMPRESSION: Status post CT-guided drainage of pelvic abscess. Sample was sent to
the lab for analysis.
